# Patient Record
Sex: Male | Born: 1990 | Race: White | Hispanic: No | Marital: Married | Smoking: Never smoker
Health system: Southern US, Community
[De-identification: ages and names within clinical notes are randomized; demographics above are authoritative.]

## PROBLEM LIST (undated history)

## (undated) DIAGNOSIS — F32A Depression, unspecified: Secondary | ICD-10-CM

## (undated) DIAGNOSIS — F419 Anxiety disorder, unspecified: Secondary | ICD-10-CM

## (undated) DIAGNOSIS — K219 Gastro-esophageal reflux disease without esophagitis: Secondary | ICD-10-CM

## (undated) DIAGNOSIS — M25561 Pain in right knee: Secondary | ICD-10-CM

## (undated) HISTORY — DX: Pain in right knee: M25.561

## (undated) HISTORY — DX: Depression, unspecified: F32.A

## (undated) HISTORY — DX: Anxiety disorder, unspecified: F41.9

## (undated) HISTORY — PX: EYE SURGERY: SHX253

## (undated) HISTORY — DX: Gastro-esophageal reflux disease without esophagitis: K21.9

---
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
## 2012-01-06 HISTORY — PX: KNEE CARTILAGE SURGERY: SHX688

## 2016-02-10 ENCOUNTER — Ambulatory Visit (INDEPENDENT_AMBULATORY_CARE_PROVIDER_SITE_OTHER): Payer: 59 | Admitting: Family Medicine

## 2016-02-10 ENCOUNTER — Encounter: Payer: Self-pay | Admitting: Family Medicine

## 2016-02-10 VITALS — BP 110/72 | HR 96 | Temp 98.2°F | Ht 70.75 in | Wt 238.2 lb

## 2016-02-10 DIAGNOSIS — G8929 Other chronic pain: Secondary | ICD-10-CM | POA: Diagnosis not present

## 2016-02-10 DIAGNOSIS — M25561 Pain in right knee: Secondary | ICD-10-CM | POA: Diagnosis not present

## 2016-02-10 DIAGNOSIS — M222X1 Patellofemoral disorders, right knee: Secondary | ICD-10-CM

## 2016-02-10 NOTE — Assessment & Plan Note (Signed)
S:Right knee pain for several years. Arthroscopic surgery in [place]. Ran track in HS- dislocated knee cap. 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  6 months later needed surgery for cartilage in the knee in  December 2013.   Since that time describes Dull pain about 5/10 stable for these years. Pain located vaguely around the kneecap or just behind it. Cortisone shot a year after surgery. Helped short term. Feels popping/clicking going up and downstairs.    2 weeks ago and at work standing up felt a sharp pain and felt like knee really tightened. Pain was on medial side.  Made him nervous to walk. Has stopped doing his working out/started taking it easy. Not doing as much walking or running.  hasnt biked since graduating may 2018 and not sure if that will bother him. New puppy. Up exercising around 4 30- walk/jogging with dog and some weights but has held off for last few weeks.  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 Will trial home exercises for next 4 weeks. We discussed evidence for nsaids is not strong but if he has another flare in pain like 2 weeks ago then can do short term icing and nsaids.   He did mention at times knee has given way but none recently. His meniscus and ligaments seemed stable today which reassured him. We discussed if he is not improving within 4 weeks that we would get a sports medicine consult with consideration of ultrasound.

## 2016-02-10 NOTE — Patient Instructions (Signed)
Sign release of information at the check out desk for 1.  records from your prior surgery and MRI and [place] 2. Immun[facility] only [facility]  Let's trial conservative measures for the patellofemoral syndrome. Focus on doing the exercises at least 4x a week for a month. If not improving at that point, I will put in a referral for one of our [place] sports medicine doctors either Dr. Berline Choughigby or Dr. Katrinka BlazingSmith

## 2016-02-10 NOTE — Progress Notes (Signed)
Phone: [phone]  Subjective:  Patient presents today to establish care. [facility]  Chief complaint-noted.   See problem oriented charting  The following were reviewed and entered/updated in epic: Past Medical History:  Diagnosis Date  . Right knee pain    Patient Active Problem List   Diagnosis Date Noted  . Right knee pain    Past Surgical History:  Procedure Laterality Date  . EYE SURGERY     "cross eyed as child" 1st grade  . KNEE CARTILAGE SURGERY Right 01/2012    Family History  Problem Relation Age of Onset  . Healthy Mother   . Stroke Father   . Hyperlipidemia Father   . Hypertension Father   . Healthy Sister   . Prostate cancer Paternal Grandfather     uknown age    Medications- reviewed and updated No current outpatient prescriptions on file.   No current facility-administered medications for this visit.     Allergies-reviewed and updated Allergies  Allergen Reactions  . Azithromycin Hives    Social History   Social History  . Marital status: Single    Spouse name: N/A  . Number of children: N/A  . Years of education: N/A   Social History Main Topics  . Smoking status: Never Smoker  . Smokeless tobacco: Never Used  . Alcohol use Yes     Comment: Social 1-2 a week  . Drug use: No  . Sexual activity: Yes   Other Topics Concern  . None   Social History Narrative   Single. Lives alone. 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       Undergrad [employer]- history major.    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       Hobbies: enjoys cooking, likes running, mountain biking    ROS--Full ROS was completed Review of Systems  Constitutional: Negative for chills and fever.  HENT: Negative for hearing loss and tinnitus.   Eyes: Negative for blurred vision and double vision.  Respiratory: Negative for cough and hemoptysis.   Cardiovascular: Negative for chest pain and  palpitations.  Gastrointestinal: Negative for heartburn and nausea.  Genitourinary: Negative for dysuria and urgency.  Musculoskeletal: Positive for joint pain (right knee). Negative for falls and myalgias.  Skin: Negative for itching and rash.  Neurological: Negative for dizziness and headaches.  Endo/Heme/Allergies: Negative for polydipsia. Does not bruise/bleed easily.  Psychiatric/Behavioral: Negative for hallucinations, substance abuse and suicidal ideas.   Objective: BP 110/72 (BP Location: Left Arm, Patient Position: Sitting, Cuff Size: Large)   Pulse 96   Temp 98.2 F (36.8 C) (Oral)   Ht 5' 10.75" (1.797 m)   Wt 238 lb 3.2 oz (108 kg)   SpO2 97%   BMI 33.46 kg/m  Gen: NAD, resting comfortably HEENT: Mucous membranes are moist. Oropharynx normal. TM normal. Eyes: sclera and lids normal, PERRLA Neck: no thyromegaly, no cervical lymphadenopathy CV: RRR no murmurs rubs or gallops Lungs: CTAB no crackles, wheeze, rhonchi Abdomen: soft/nontender/nondistended/normal bowel sounds. No rebound or guarding.  Ext: no edema Skin: warm, dry Msk: see below Neuro: 5/5 strength in upper and lower extremities, normal gait, normal reflexes  Bilateral Knee: Normal to inspection with no erythema or effusion or obvious bony abnormalities. Palpation normal with no warmth or joint line tenderness or patellar tenderness or condyle tenderness. ROM normal in flexion and extension and lower leg rotation. Ligaments with solid consistent endpoints including ACL, PCL, LCL, MCL. Negative Mcmurray's and  provocative meniscal tests. There is a click reproducible on right knee but feels like this is around the patella and produces no pain.  On left- Non painful patellar compression. On right mild vague discomfort with compression.  Patellar and quadriceps tendons unremarkable. Hamstring and quadriceps strength is normal.   Assessment/Plan:  Right knee pain S:Right knee pain for several years.  Arthroscopic surgery in [place]. Ran track in HS- dislocated knee cap. 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  6 months later needed surgery for cartilage in the knee in  December 2013.   Since that time describes Dull pain about 5/10 stable for these years. Pain located vaguely around the kneecap or just behind it. Cortisone shot a year after surgery. Helped short term. Feels popping/clicking going up and downstairs.    2 weeks ago and at work standing up felt a sharp pain and felt like knee really tightened. Pain was on medial side.  Made him nervous to walk. Has stopped doing his working out/started taking it easy. Not doing as much walking or running.  hasnt biked since graduating may 2018 and not sure if that will bother him. New puppy. Up exercising around 4 30- walk/jogging with dog and some weights but has held off for last few weeks.  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 Will trial home exercises for next 4 weeks. We discussed evidence for nsaids is not strong but if he has another flare in pain like 2 weeks ago then can do short term icing and nsaids.   He did mention at times knee has given way but none recently. His meniscus and ligaments seemed stable today which reassured him. We discussed if he is not improving within 4 weeks that we would get a sports medicine consult with consideration of ultrasound.   The duration of face-to-face time during this visit was greater than 30 minutes. Greater than 50% of this time was spent in counseling about dealing with chronicity of pain, nervousness about more serious injury, fact I am more concerned because of prior surgery and lower threshold for referral than usually for someone his age- in addition the giving way we discussed raised my concern- glad has not happened recently. Discussing treatment options and data on some. He may end up doing a knee sleeve while being active at present.    Did not get gardasil. States  he did have meningitis and varicella- will get records. Also to get MRI records from [place] and ortho notes   Return precautions advised.  Tana Conch, MD

## 2016-02-10 NOTE — Progress Notes (Signed)
Pre visit review using our clinic review tool, if applicable. No additional management support is needed unless otherwise documented below in the visit note. 

## 2017-11-25 ENCOUNTER — Ambulatory Visit: Payer: BLUE CROSS/BLUE SHIELD | Admitting: Family Medicine

## 2017-11-25 ENCOUNTER — Encounter: Payer: Self-pay | Admitting: Family Medicine

## 2017-11-25 ENCOUNTER — Encounter: Payer: Self-pay | Admitting: Sports Medicine

## 2017-11-25 ENCOUNTER — Ambulatory Visit: Payer: BLUE CROSS/BLUE SHIELD | Admitting: Sports Medicine

## 2017-11-25 ENCOUNTER — Ambulatory Visit: Payer: Self-pay

## 2017-11-25 VITALS — BP 122/82 | HR 70 | Ht 70.75 in | Wt 244.6 lb

## 2017-11-25 VITALS — BP 122/82 | HR 70 | Temp 98.5°F | Ht 70.75 in | Wt 244.6 lb

## 2017-11-25 DIAGNOSIS — Z23 Encounter for immunization: Secondary | ICD-10-CM

## 2017-11-25 DIAGNOSIS — M25561 Pain in right knee: Secondary | ICD-10-CM

## 2017-11-25 NOTE — Patient Instructions (Signed)
You had an injection today.  Things to be aware of after injection are listed below: . You may experience no significant improvement or even a slight worsening in your symptoms during the first 24 to 48 hours.  After that we expect your symptoms to improve gradually over the next 2 weeks for the medicine to have its maximal effect.  You should continue to have improvement out to 6 weeks after your injection. . Dr. Caralyn Twining recommends icing the site of the injection for 20 minutes  1-2 times the day of your injection . You may shower but no swimming, tub bath or Jacuzzi for 24 hours. . If your bandage falls off this does not need to be replaced.  It is appropriate to remove the bandage after 4 hours. . You may resume light activities as tolerated unless otherwise directed per Dr. Kamiya Acord during your visit  POSSIBLE STEROID SIDE EFFECTS:  Side effects from injectable steroids tend to be less than when taken orally however you may experience some of the symptoms listed below.  If experienced these should only last for a short period of time. Change in menstrual flow  Edema (swelling)  Increased appetite Skin flushing (redness)  Skin rash/acne  Thrush (oral) Yeast vaginitis    Increased sweating  Depression Increased blood glucose levels Cramping and leg/calf  Euphoria (feeling happy)  POSSIBLE PROCEDURE SIDE EFFECTS: The side effects of the injection are usually fairly minimal however if you may experience some of the following side effects that are usually self-limited and will is off on their own.  If you are concerned please feel free to call the office with questions:  Increased numbness or tingling  Nausea or vomiting  Swelling or bruising at the injection site   Please call our office if if you experience any of the following symptoms over the next week as these can be signs of infection:   Fever greater than 100.5F  Significant swelling at the injection site  Significant redness or drainage  from the injection site  If after 2 weeks you are continuing to have worsening symptoms please call our office to discuss what the next appropriate actions should be including the potential for a return office visit or other diagnostic testing.    

## 2017-11-25 NOTE — Procedures (Signed)
PROCEDURE NOTE:  Ultrasound Guided: Aspiration and Injection: Right knee Images were obtained and interpreted by myself, Gaspar Bidding, DO  Images have been saved and stored to PACS system. Images obtained on: GE S7 Ultrasound machine    ULTRASOUND FINDINGS:  Large effusion.  DESCRIPTION OF PROCEDURE:  The patient's clinical condition is marked by substantial pain and/or significant functional disability. Other conservative therapy has not provided relief, is contraindicated, or not appropriate. There is a reasonable likelihood that injection will significantly improve the patient's pain and/or functional impairment.   After discussing the risks, benefits and expected outcomes of the injection and all questions were reviewed and answered, the patient wished to undergo the above named procedure.  Verbal consent was obtained.  The ultrasound was used to identify the target structure and adjacent neurovascular structures. The skin was then prepped in sterile fashion and the target structure was injected under direct visualization using sterile technique as below:  Single injection performed as below: PREP: Alcohol, Ethel Chloride and 5 cc 1% lidocaine on 25g 1.5 in. needle APPROACH:superiolateral, stopcock technique, 18g 1.5 in. INJECTATE: 2 cc 0.5% Marcaine and 2 cc 40mg /mL DepoMedrol ASPIRATE: 55mL  and clear, bloody and straw colored  DRESSING: Band-Aid and 6-inch Ace Wrap  Post procedural instructions including recommending icing and warning signs for infection were reviewed.    This procedure was well tolerated and there were no complications.   IMPRESSION: Succesful Ultrasound Guided: Aspiration and Injection

## 2017-11-25 NOTE — Progress Notes (Signed)
Subjective:  Marvin Mitchell is a 27 y.o. year old very pleasant male patient who presents for/with See problem oriented charting ROS- no fever or chills.  Right knee pain noted.  Right knee effusion noted  Past Medical History-  Patient Active Problem List   Diagnosis Date Noted  . Right knee pain     Medications- reviewed and updated No current outpatient medications on file.   No current facility-administered medications for this visit.     Objective: BP 122/82 (BP Location: Left Arm, Patient Position: Sitting, Cuff Size: Large)   Pulse 70   Temp 98.5 F (36.9 C) (Oral)   Ht 5' 10.75" (1.797 m)   Wt 244 lb 9.6 oz (110.9 kg)   SpO2 98%   BMI 34.36 kg/m  Gen: NAD, resting comfortably CV: RRR  Lungs: nonlabored, normal respiratory rate Abdomen: soft/nondistended Ext: no pretibial edema Skin: warm, dry  Right Knee: Inspection with no erythema. Effusion noted. No obvious bony abnormalities.  Assessment/Plan:  Acute pain of right knee - Plan: Ambulatory referral to Sports Medicine S: Patient with years of dull chronic right knee pain. From last visit in 02/2016 "Right knee pain for several years. Arthroscopic surgery in [place]. Ran track in HS- dislocated knee cap. 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  6 months later needed surgery for cartilage in the knee in  December 2013.   Since that time describes Dull pain about 5/10 stable for these years. Pain located vaguely around the kneecap or just behind it. Cortisone shot a year after surgery. Helped short term. Feels popping/clicking going up and downstairs.  "  He had a flare up of pain around that time-We treated him for possible patellofemoral syndrome at that time- he did better at that time but went back to baseline level of pain.   Has wanted to start running in recent years but just hasnt trusted the knee enough. With more controlled movements like squats he feels like he can tell when an  issues is coming up. We had considered sports medicine referral last year but given improvement in pain with home exercises opted out.   Today, states over last few weeks has had worsening pain with motion of right knee. When he gets past about 90 degrees bending down- quadriceps down into shins feels like a rubber band being pulled across an arch. Has had to sit on the ground or  lean over by back to pick things upas doesn't feel he can bend on right knee- can bend with left knee without issue. Pain can be at least moderate aching in intensity. Icing helps some.   Has been working out a lot more in general so was wondering if it was related to that. Yesterday was doing fair amount of lifting but was avoiding bending the knee- but by yesterday afternoon knee felt really tight. Noted significant swelling for first time yesterday. Iced last night and this morning but remained larger.  He hears a lot of clicking and popping in his knees in general. A/P: 27 year old male with history of right knee surgery now with acute worsening of pain in last few weeks and particularly in last 2 days associated with effusion. Will refer to sports medicine Marvin Mitchell for further management. Since he feels so uncomfortable with flexing lower leg- we did not do a full knee exam today until Marvin Mitchell can evaluate.   Future Appointments  Date Time Provider Department Center  11/25/2017  2:40 PM Andrena Mews, DO [facility]   Lab/Order associations: Need for prophylactic vaccination and inoculation against influenza - Plan: Flu Vaccine QUAD 36+ mos IM  Acute pain of right knee - Plan: Ambulatory referral to Sports Medicine  Return precautions advised.  Tana Conch, MD

## 2017-11-25 NOTE — Progress Notes (Signed)
Marvin Mitchell. [facility] at [facility] [phone]  Marvin Mitchell - 27 y.o. male MRN [id number]  Date of birth: 1990/02/26  Visit Date: 11/25/2017  PCP: Marvin Majestic, MD   Referred by: Marvin Majestic, MD   Scribe(s) for today's visit: Marvin Mitchell, CMA  SUBJECTIVE:  Marvin Sousa "Alex" is here for Initial Assessment (R knee pain)  Referred by: Dr. Tana Mitchell  HPI: His R knee pain symptoms INITIALLY: Began several years ago after dislocating his knee running track, 01/2012. He reports constant dull pain since then.  Described as moderate (6/10) tightness and warmth, nonradiating Worsened with bending Improved with nonweightbearing, though improvement is minimal. Additional associated symptoms include: Her has noticing popping in his when going up and down stairs (this happens with both knees). He can hear and feel the popping. He feels a lot of tension in his knee when bending beyond 90 degrees, the more he bends the knee, the worse it gets.     At this time symptoms are worsening compared to onset, this started about 1 month ago and seems to be getting progressively worse. He was helping to move over the weekend. Sunday he noticed a lot of tightness and swelling around the knee, R knee was "three times the size" of his L knee.  He has been taking IBU with minimal relief. He tried icing his knee and didn't notice any change with swelling.   No recent XR of the R knee, last done 2014.   REVIEW OF SYSTEMS: Reports night time disturbances. Denies fevers, chills, or night sweats. Denies unexplained weight loss. Denies personal history of cancer. Denies changes in bowel or bladder habits. Denies recent unreported falls. Denies new or worsening dyspnea or wheezing. Denies headaches or dizziness.  Denies numbness, tingling or weakness  In the extremities.  Denies dizziness or presyncopal episodes Reports lower extremity  edema    HISTORY:  Prior history reviewed and updated per electronic medical record.  Social History   Occupational History  . Not on file  Tobacco Use  . Smoking status: Never Smoker  . Smokeless tobacco: Never Used  Substance and Sexual Activity  . Alcohol use: Yes    Comment: Social 1-2 a week  . Drug use: No  . Sexual activity: Yes   Social History   Social History Narrative   Engaged! Getting married December 28th 2019. 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       Undergrad [employer]- history major.    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       Hobbies: enjoys cooking, likes running, mountain biking    Past Medical History:  Diagnosis Date  . Right knee pain    Past Surgical History:  Procedure Laterality Date  . EYE SURGERY     "cross eyed as child" 1st grade  . KNEE CARTILAGE SURGERY Right 01/2012   family history includes Healthy in his mother and sister; Hyperlipidemia in his father; Hypertension in his father; Prostate cancer in his paternal grandfather; Stroke in his father.  DATA OBTAINED & REVIEWED:  No results for input(s): HGBA1C, LABURIC, CREATINE in the last 8760 hours. .   OBJECTIVE:  VS:  HT:5' 10.75" (179.7 cm)   WT:244 lb 9.6 oz (110.9 kg)  BMI:34.36    BP:122/82  HR:70bpm  TEMP: ( )  RESP:98 %   PHYSICAL EXAM: CONSTITUTIONAL: Well-developed, Well-nourished and In no acute distress PSYCHIATRIC: Alert &  appropriately interactive. and Not depressed or anxious appearing. RESPIRATORY: No increased work of breathing and Trachea Midline EYES: Pupils are equal., EOM intact without nystagmus. and No scleral icterus.  VASCULAR EXAM: Warm and well perfused NEURO: unremarkable  MSK Exam: Right knee  Well aligned, no significant deformity. No overlying skin changes. TTP over Medial joint line without focality.  Large effusion.   RANGE OF MOTION & STRENGTH  Limited knee flexion by approximately 25 degrees  compared to the contralateral side.   SPECIALITY TESTING:  Pain with McMurray's and crepitation slightly localized in the medial joint line.  Slightly increased laxity with dial testing on the right, stable on the left.  Ligamentously stable to anterior drawer, posterior drawer, varus and valgus strain.    ASSESSMENT   1. Acute pain of right knee     PLAN:  Pertinent additional documentation may be included in corresponding procedure notes, imaging studies, problem based documentation and patient instructions.  Procedures:  . US Guided Injection per procedure note  Medications:  No orders of the defined types were placed in this encounter.  Discussion/Instructions: Right knee pain Large effusion today.  Aspiration and injection performed.  If any lack of improvement will need x-rays and or MRI for concern for potential recurrent meniscal tear.  Would also keep posterior lateral corner injury in the differential given the slightly increased laxity with dial test  .   . Discussed red flag symptoms that warrant earlier emergent evaluation and patient voices understanding. . Activity modifications and the importance of avoiding exacerbating activities (limiting pain to no more than a 4 / 10 during or following activity) recommended and discussed.  Follow-up:  . Return in about 4 weeks (around 12/23/2017).   . If any lack of improvement consider: further diagnostic evaluation with Plain film x-rays and MRI.  Marland Kitchen At follow up will plan to consider: Increase HEP     CMA/ATC served as scribe during this visit. History, Physical, and Plan performed by medical provider. Documentation and orders reviewed and attested to.      Marvin Mews, DO     Sports Medicine Physician

## 2017-11-25 NOTE — Patient Instructions (Addendum)
Health Maintenance Due  Topic Date Due  . INFLUENZA VACCINE -completed today 09/05/2017   Please schedule a visit with our sports medicine physician Dr. Berline Chough before you leave at the check out desk so he can further evaluate your right knee pain- ask them to put you in the 2 40 slot with him.

## 2017-11-25 NOTE — Assessment & Plan Note (Signed)
Large effusion today.  Aspiration and injection performed.  If any lack of improvement will need x-rays and or MRI for concern for potential recurrent meniscal tear.  Would also keep posterior lateral corner injury in the differential given the slightly increased laxity with dial test

## 2017-12-24 ENCOUNTER — Ambulatory Visit: Payer: BLUE CROSS/BLUE SHIELD | Admitting: Sports Medicine

## 2018-01-01 ENCOUNTER — Ambulatory Visit: Payer: BLUE CROSS/BLUE SHIELD | Admitting: Sports Medicine

## 2018-01-09 ENCOUNTER — Ambulatory Visit: Payer: BLUE CROSS/BLUE SHIELD | Admitting: Sports Medicine

## 2019-01-08 ENCOUNTER — Other Ambulatory Visit: Payer: Self-pay

## 2019-01-09 ENCOUNTER — Encounter: Payer: Self-pay | Admitting: Family Medicine

## 2019-01-09 ENCOUNTER — Ambulatory Visit (INDEPENDENT_AMBULATORY_CARE_PROVIDER_SITE_OTHER): Payer: BC Managed Care – PPO | Admitting: Family Medicine

## 2019-01-09 VITALS — BP 112/90 | HR 87 | Temp 98.7°F | Ht 70.75 in | Wt 230.4 lb

## 2019-01-09 DIAGNOSIS — Z23 Encounter for immunization: Secondary | ICD-10-CM

## 2019-01-09 DIAGNOSIS — G8929 Other chronic pain: Secondary | ICD-10-CM | POA: Diagnosis not present

## 2019-01-09 DIAGNOSIS — M25511 Pain in right shoulder: Secondary | ICD-10-CM

## 2019-01-09 MED ORDER — MELOXICAM 15 MG PO TABS: 15.0000 mg | ORAL_TABLET | Freq: Every day | ORAL | 0 refills | Status: DC

## 2019-01-09 NOTE — Patient Instructions (Addendum)
Health Maintenance Due  Topic Date Due  . INFLUENZA VACCINE -today 09/06/2018  . TETANUS/TDAP -today 12/02/2018   Team- please give him a copy of rotator cuff exercises. Do these 3x a week as instructed. Stop ANY exercise that causes more than 1-2/10 pain and go to next one.   Marvin Mitchell- Try meloxicam once a day for 10 days. If any reflux could take pepcid or omeprazole with this. This is to try to calm down inflammation  If not making progress within 7-10 days let me get you in with Dr. Tamala Julian or Dr. Georgina Snell of sports medicine- I think this is a partial rotator cuff tear given you have good strength and ROM still but they may think its worth ultrasound or injection at that point

## 2019-01-09 NOTE — Progress Notes (Signed)
Phone [phone] In person visit   Subjective:   Marvin Mitchell is a 28 y.o. year old very pleasant male patient who presents for/with See problem oriented charting Chief Complaint  Patient presents with  . Follow-up  . shoulder pain   ROS- no fever/chills/cough/congestion.  Does complain of right shoulder pain  This visit occurred during the SARS-CoV-2 public health emergency.  Safety protocols were in place, including screening questions prior to the visit, additional usage of staff PPE, and extensive cleaning of exam room while observing appropriate contact time as indicated for disinfecting solutions.   Past Medical History-  Patient Active Problem List   Diagnosis Date Noted  . Right knee pain     Medications- reviewed and updated Current Outpatient Medications  Medication Sig Dispense Refill  . meloxicam (MOBIC) 15 MG tablet Take 1 tablet (15 mg total) by mouth daily. 10 tablet 0   No current facility-administered medications for this visit.      Objective:  BP 112/90   Pulse 87   Temp 98.7 F (37.1 C)   Ht 5' 10.75" (1.797 m)   Wt 230 lb 6.4 oz (104.5 kg)   SpO2 98%   BMI 32.36 kg/m  Gen: NAD, resting comfortably  CV: RRR  Lungs: nonlabored, normal respiratory rate Abdomen: soft/nondistended   Shoulder: Inspection reveals no abnormalities, atrophy or asymmetry. Palpation is normal with no tenderness over AC joint or bicipital groove. ROM is full in all planes. Rotator cuff strength normal throughout. With rotating right arm backwards- slight popping sound heard signs of impingement with positive Hawkin's BUT  negative Neer and Hawkin's tests Patient does have significant pain with resisted internal rotation.  No painful arc and no drop arm sign. No apprehension sign       Assessment and Plan   #social update- signed on first home on Monday. 1st year of marriage going well- dec 2019  # Right Shoulder pain S:pt c/o right shoulder pain  that started about 6 weeks ago. States was at the gym when he noted it but didn't note an actual injury- just over time arm felt weaker and was having trouble lifting over 10 lbs. Has taken time off lifting since then.  Attributes weakness to the pain. Did not receive flu shot until today.   Pt states he has been doing ice and heat and ibuprofen with some relief. Pt states pain comes and goes and can get to a 7/10. No swelling or redness around the shoulder.   No history of shoulder injuries. When doing overhead lifts in the past has noted a slight popping sensation in the right shoulder or in incline bench- basically anything above 135 degrees but left shoulder does not have same sensation- never had pain with this.  A/P: I suspect patient has a rotator cuff tear-possibly the subscapularis.  Strength overall still good.  I think a trial of NSAIDs and therapeutic exercise is reasonable.  We discussed if not improving in the next 7 to 10 days that we would go ahead and refer to sports medicine for consideration of ultrasound and injection.  Recommended follow up: # likely need physical and bloodwork sometime in next 6-12 months-discussed completing the since we have not done prior blood work.  He has no reported kidney disease history so we thought a trial of NSAID would be reasonable  Lab/Order associations:   ICD-10-CM   1. Chronic right shoulder pain  M25.511    G89.29   2. Need for Tdap  vaccination  Z23 Tdap vaccine greater than or equal to 7yo IM  3. Need for immunization against influenza  Z23 Flu Vaccine QUAD 36+ mos IM    Meds ordered this encounter  Medications  . meloxicam (MOBIC) 15 MG tablet    Sig: Take 1 tablet (15 mg total) by mouth daily.    Dispense:  10 tablet    Refill:  0    Return precautions advised.  Garret Reddish, MD

## 2019-01-10 ENCOUNTER — Encounter: Payer: Self-pay | Admitting: Family Medicine

## 2019-01-22 ENCOUNTER — Encounter: Payer: Self-pay | Admitting: Family Medicine

## 2019-01-23 ENCOUNTER — Other Ambulatory Visit: Payer: Self-pay

## 2019-01-23 DIAGNOSIS — G8929 Other chronic pain: Secondary | ICD-10-CM

## 2019-01-23 DIAGNOSIS — M25511 Pain in right shoulder: Secondary | ICD-10-CM

## 2019-02-04 ENCOUNTER — Other Ambulatory Visit: Payer: Self-pay

## 2019-02-04 ENCOUNTER — Ambulatory Visit (INDEPENDENT_AMBULATORY_CARE_PROVIDER_SITE_OTHER): Payer: BC Managed Care – PPO

## 2019-02-04 ENCOUNTER — Ambulatory Visit: Payer: BC Managed Care – PPO | Admitting: Family Medicine

## 2019-02-04 ENCOUNTER — Encounter: Payer: Self-pay | Admitting: Family Medicine

## 2019-02-04 VITALS — BP 118/84 | HR 84 | Ht 70.75 in | Wt 232.4 lb

## 2019-02-04 DIAGNOSIS — M25511 Pain in right shoulder: Secondary | ICD-10-CM

## 2019-02-04 NOTE — Patient Instructions (Signed)
Thank you for coming in today. Attend PT.  Recheck in 4 weeks.  Next step if not better is likely MRI arthrogram.  I am concerned about a SLAP labrum tear.    SLAP Lesions Rehab Ask your health care provider which exercises are safe for you. Do exercises exactly as told by your health care provider and adjust them as directed. It is normal to feel mild stretching, pulling, tightness, or discomfort as you do these exercises. Stop right away if you feel sudden pain or your pain gets worse. Do not begin these exercises until told by your health care provider. Stretching and range-of-motion exercise This exercise warms up your muscles and joints and improves the movement and flexibility of your shoulder. This exercise also helps to relieve pain and stiffness. Passive shoulder horizontal adduction In passive adduction, you use your other hand to move the injured arm toward your body. The injured arm does not move on its own (passive). In this movement, your arm is moved across your body in the horizontal plane (horizontal adduction). 1. Sit or stand and pull your left / right elbow across your chest, toward your other shoulder. Stop when you feel a gentle stretch in the back of your shoulder and upper arm. ? Keep your arm at shoulder height. ? Keep your arm as close to your body as you comfortably can. 2. Hold for __________ seconds. 3. Slowly return to the starting position. Repeat __________ times. Complete this exercise __________ times a day. Strengthening exercises These exercises build strength and endurance in your shoulder. Endurance is the ability to use your muscles for a long time, even after they get tired. Scapular protraction, supine  1. Lie on your back on a firm surface (supine position). Hold a __________ lb / kg weight in your left / right hand. 2. Raise your left / right arm straight into the air so your hand is directly above your shoulder joint. 3. Push the weight into the  air so your shoulder (scapula) lifts off the surface that you are lying on. The scapula will push up or forward (protraction). Do not move your head, neck, or back. 4. Hold for __________ seconds. 5. Slowly return to the starting position. Let your muscles relax completely before you repeat this exercise. Repeat __________ times. Complete this exercise __________ times a day. Scapular retraction  1. Sit in a stable chair without armrests, or stand up. 2. Secure an exercise band to a stable object in front of you so the band is at shoulder height. 3. Hold one end of the exercise band in each hand. Your palms should face down. 4. Squeeze your shoulder blades (scapulae) together and move your elbows slightly behind you (retraction). Do not shrug your shoulders. 5. Hold for __________ seconds. 6. Slowly return to the starting position. Repeat __________ times. Complete this exercise __________ times a day. Shoulder external rotation 1. Lie down on your uninjured side. Place a soft object, such as a small folded towel, between your left / right arm (your top arm) and your body. 2. Bend your left / right elbow to a 90-degree angle (right angle). Place your left / right hand palm-down on your abdomen. Squeeze your shoulder blade back. 3. Keeping your elbow bent to a 90-degree angle, move your left / right forearm away from your abdomen (external rotation). ? Your upper arm should not move off the folded towel. ? Keep your shoulder blade back. 4. Hold for __________ seconds. 5. Slowly return  to the starting position. Repeat __________ times. Complete this exercise __________ times a day. Shoulder extension, prone  1. Lie on your abdomen (prone position) on a firm surface so your left / right arm hangs over the edge. 2. Hold a __________ lb / kg weight in your hand so your palm faces in toward your body. Your arm should be straight. 3. Squeeze your shoulder blade down toward the middle of your back.  4. Slowly raise your arm behind you, up to the height of the surface that you are lying on (extension). Keep your arm straight. 5. Hold for __________ seconds. 6. Slowly return to the starting position and relax your muscles. Repeat __________ times. Complete this exercise __________ times a day. This information is not intended to replace advice given to you by your health care provider. Make sure you discuss any questions you have with your health care provider. Document Released: 01/22/2005 Document Revised: 05/20/2018 Document Reviewed: 05/20/2018 Elsevier Patient Education  2020 [employer].

## 2019-02-04 NOTE — Progress Notes (Signed)
Subjective:    I'm seeing this patient as a consultation for:  Dr. Yong Channel. Note will be routed back to referring provider/PCP.  CC: R shoulder pain  I, Molly Weber, LAT, ATC, am serving as scribe for Dr. Lynne Leader.  HPI: Pt is a 28 y/o male presenting w/ c/o R shoulder pain x approximately 2 months w/ no specific MOI.  Pt recalls noticing pain while at the gym but doesn't recall a specific injury.  Pt notes weakness in his R UE.  Pt has intermittent pain that can increase to a 7/10 at it's worst.  Since seeing his PCP on 01/09/19, pt reports that his R shoulder feels about the same.  He has avoided going to the gym but states that he aggravated recently when moving into his new home.  He notes radiating pain into the R upper arm.  He rates his pain as an aching 5/10 pain on average.  Aggravating factors include laying on his R side and horizontal aBd.  He notes popping/clicking in his R shoulder.  He denies neck pain or numbness/tingling into the R UE.  He is not doing any regular treatment for his symptoms.    Past medical history, Surgical history, Family history not pertinant except as noted below, Social history, Allergies, and medications have been entered into the medical record, reviewed, and no changes needed.   Review of Systems: No headache, visual changes, nausea, vomiting, diarrhea, constipation, dizziness, abdominal pain, skin rash, fevers, chills, night sweats, weight loss, swollen lymph nodes, body aches, joint swelling, muscle aches, chest pain, shortness of breath, mood changes, visual or auditory hallucinations.   Objective:    Vitals:   02/04/19 1457  BP: 118/84  Pulse: 84  SpO2: 96%   General: Well Developed, well nourished, and in no acute distress.  Neuro/Psych: Alert and oriented x3, extra-ocular muscles intact, able to move all 4 extremities, sensation grossly intact. Skin: Warm and dry, no rashes noted.  Respiratory: Not using accessory muscles, speaking in full  sentences, trachea midline.  Cardiovascular: Pulses palpable, no extremity edema. Abdomen: Does not appear distended. MSK:  C-spine: Normal-appearing. Nontender. Normal motion. Upper extremity strength reflexes and sensation are normal and equal throughout bilateral upper extremities.  Right shoulder: Normal-appearing Not particularly tender to palpation. Range of motion full abduction external and internal rotation.  Palpable and audible click with abduction Strength normal intact 5/5 abduction external and internal rotation. Negative Hawkins and Neer's test.  Negative empty can test.  Negative crossover arm compression test. Positive sulcus sign with axial traction. Mildly positive O'Brien test. Negative/normal anterior and posterior apprehension and relocation tests. Positive Yergason's and speeds test mildly  Contralateral left shoulder: Normal-appearing Nontender Normal motion. Normal strength. Negative impingement biceps and labrum testing.  Pulses cap refill and sensation intact bilateral upper extremities.  Lab and Radiology Results  Limited musculoskeletal ultrasound right shoulder Biceps tendon normal-appearing intact in bicipital groove. Normal subscapularis tendon. Supraspinatus tendon is normal-appearing.  Moderate increased thickness of subacromial bursa present. Infraspinatus tendon normal-appearing No significant AC DJD or effusion. V-shaped groove or deformity present at posterior aspect of the humeral head. Impression: No significant biceps or rotator cuff tendinitis visible on ultrasound. Mild subacromial bursa thickening. Possible Hill-Sachs deformity  X-ray right shoulder ordered today but patient elected to have it performed in the near future.  Impression and Recommendations:    Assessment and Plan: 28 y.o. male with  Right shoulder pain present for 2 months.  Patient has had trials of  some initial conservative management with his primary care  provider with moderate improvement.  However he continues to have significant dysfunction and mechanical symptoms and pain especially with weightlifting.  Based on physical exam and ultrasound findings today concern for SLAP labrum tear. Discussed treatment plan and options.  We will continue trial of conservative management we will proceed with trial of physical therapy.  Check back in about 4 weeks.  If no improvement next step would be MRI arthrogram.  Extensive discussion with patient expresses understanding and agreement.   Orders Placed This Encounter  Procedures  . [place] - Upper Extremity - Limited - RIGHT    Order Specific Question:   Reason for Exam (SYMPTOM  OR DIAGNOSIS REQUIRED)    Answer:   R shoulder pain    Order Specific Question:   Preferred imaging location?    Answer:   [facility]  . DG Shoulder Right    Standing Status:   Future    Standing Expiration Date:   04/04/2020    Order Specific Question:   Reason for Exam (SYMPTOM  OR DIAGNOSIS REQUIRED)    Answer:   right shoudler pain cocnern labrum    Order Specific Question:   Preferred imaging location?    Answer:   [facility]    Order Specific Question:   Radiology Contrast Protocol - do NOT remove file path    Answer:   [email or web address]  . Ambulatory referral to Physical Therapy    Referral Priority:   Routine    Referral Type:   Physical Medicine    Referral Reason:   Specialty Services Required    Requested Specialty:   Physical Therapy   No orders of the defined types were placed in this encounter.   Discussed warning signs or symptoms. Please see discharge instructions. Patient expresses understanding.   The above documentation has been reviewed and is accurate and complete Clementeen Graham

## 2019-02-12 ENCOUNTER — Ambulatory Visit (INDEPENDENT_AMBULATORY_CARE_PROVIDER_SITE_OTHER): Payer: BC Managed Care – PPO

## 2019-02-12 ENCOUNTER — Other Ambulatory Visit: Payer: Self-pay

## 2019-02-12 ENCOUNTER — Ambulatory Visit (INDEPENDENT_AMBULATORY_CARE_PROVIDER_SITE_OTHER): Payer: BC Managed Care – PPO | Admitting: Physical Therapy

## 2019-02-12 ENCOUNTER — Other Ambulatory Visit: Payer: BC Managed Care – PPO

## 2019-02-12 DIAGNOSIS — M25511 Pain in right shoulder: Secondary | ICD-10-CM

## 2019-02-13 NOTE — Progress Notes (Signed)
Xray shoulder is normal appearing to radiology.

## 2019-02-16 ENCOUNTER — Encounter: Payer: Self-pay | Admitting: Physical Therapy

## 2019-02-16 NOTE — Patient Instructions (Signed)
Shoulder IR/ER GTB x20  Rows GTB x20 Horizontal abd 2x10 GTB Supine SA punch 3 lb bil, x20;  All 1x/day for HEP

## 2019-02-16 NOTE — Therapy (Signed)
[facility] [address] [place], [place], [address] Phone: [phone]   Fax:  [phone]  Physical Therapy Evaluation  Patient Details  Name: Marvin Mitchell MRN: [id number] Date of Birth: 04/17/1990 Referring Provider (PT): Lynne Leader   Encounter Date: 02/12/2019  PT End of Session - 02/16/19 0832    Visit Number  1    Number of Visits  12    Date for PT Re-Evaluation  03/26/19    Authorization Type  BC[employer]    PT Start Time  1518    PT Stop Time  1600    PT Time Calculation (min)  42 min    Activity Tolerance  Patient tolerated treatment well    Behavior During Therapy  [facility] for tasks assessed/performed       Past Medical History:  Diagnosis Date  . Right knee pain     Past Surgical History:  Procedure Laterality Date  . EYE SURGERY     "cross eyed as child" 1st grade  . KNEE CARTILAGE SURGERY Right 01/2012    There were no vitals filed for this visit.   Subjective Assessment - 02/16/19 0835    Subjective  Pain started in [place], in gym, lifting overhead. Has not been to gym since.  States mild improvment since start.  Pt works full time in [place], on computer. States he has always had loud click in shoulder on R.    Limitations  Lifting;House hold activities    Patient Stated Goals  decreased pain    Currently in Pain?  Yes    Pain Score  6     Pain Location  Shoulder    Pain Orientation  Right    Pain Descriptors / Indicators  Aching    Pain Type  Acute pain    Pain Onset  More than a month ago    Pain Frequency  Intermittent         [facility] PT Assessment - 02/16/19 0824      Assessment   Medical Diagnosis  R shoulder pain    Referring Provider (PT)  Lynne Leader    Hand Dominance  Right    Prior Therapy  no      Balance Screen   Has the patient fallen in the past 6 months  No      Prior Function   Level of Independence  Independent      Cognition   Overall Cognitive Status  Within Functional Limits  for tasks assessed      AROM   Overall AROM Comments  R shoulder: WNL,       Strength   Strength Assessment Site  Shoulder    Right/Left Shoulder  Right    Right Shoulder Flexion  4/5    Right Shoulder ABduction  4/5    Right Shoulder Internal Rotation  4+/5    Right Shoulder External Rotation  4+/5      Palpation   Palpation comment  Minimal pain to palpate R shoulder, pain deep in ant/superior aspect of shoulder. Audible pop with lowering arm from abduction(baseline)       Special Tests   Other special tests  Unremarkable                Objective measurements completed on examination: See above findings.      [facility] Adult PT Treatment/Exercise - 02/16/19 0001      Exercises   Exercises  Shoulder      Shoulder  Exercises: Supine   Protraction  20 reps    Protraction Weight (lbs)  3    Protraction Limitations  SA punch      Shoulder Exercises: Standing   Horizontal ABduction  15 reps    Theraband Level (Shoulder Horizontal ABduction)  Level 2 (Red)    External Rotation  20 reps;Theraband    Theraband Level (Shoulder External Rotation)  Level 3 (Green)    Internal Rotation  20 reps;Theraband    Theraband Level (Shoulder Internal Rotation)  Level 3 (Green)    Row  20 reps;Theraband    Theraband Level (Shoulder Row)  Level 3 Chilton Si)             PT Education - 02/16/19 0630    Education Details  HEP, PT POC    Person(s) Educated  Patient    Methods  Explanation;Demonstration;Verbal cues;Handout    Comprehension  Verbalized understanding;Returned demonstration;Verbal cues required;Need further instruction       PT Short Term Goals - 02/16/19 2114      PT SHORT TERM GOAL #1   Title  Pt to be independent with initial HEP    Time  2    Period  Weeks    Status  New    Target Date  02/26/19        PT Long Term Goals - 02/16/19 2115      PT LONG TERM GOAL #1   Title  Pt to be independent with final HEP    Time  6    Period  Weeks    Status   New    Target Date  03/26/19      PT LONG TERM GOAL #2   Title  Pt to report decreased pain in R shoulder to 0-2/10 with activity    Time  6    Period  Weeks    Status  New    Target Date  03/26/19      PT LONG TERM GOAL #3   Title  Pt to demo improved strength in  R shoulder, to be 5/5, to improve ability for lifting, and IADLS.    Time  6    Period  Weeks    Status  New    Target Date  03/26/19      PT LONG TERM GOAL #4   Title  Pt to demo ability for reaching, lifting, carrying, up to 20 lb, with no pain in R shoulder.    Time  6    Period  Weeks    Status  New    Target Date  03/26/19             Plan - 02/16/19 2121    Clinical Impression Statement  Pt presents with primary complaint of increased pain in R shoulder. He has decreased strength and stabiliy of R shoulder, and increased pain with repeated motions and behind the back motions. Pt with decreased ability for full functional activities, due to pain. pt to benefit from skilled PT to improve deficits and pain.    Examination-Activity Limitations  Reach Overhead;Carry;Lift    Examination-Participation Restrictions  Cleaning;Community Activity;Driving;Yard Work    Stability/Clinical Decision Making  Stable/Uncomplicated    Clinical Decision Making  Low    Rehab Potential  Good    PT Frequency  2x / week    PT Duration  6 weeks    PT Treatment/Interventions  ADLs/Self Care Home Management;Cryotherapy;Electrical Stimulation;Iontophoresis 4mg /ml Dexamethasone;Moist Heat;Ultrasound;DME Instruction;Neuromuscular re-education;Therapeutic exercise;Therapeutic activities;Functional mobility training;Patient/family  education;Manual techniques;Taping;Dry needling;Passive range of motion;Vasopneumatic Device;Spinal Manipulations;Joint Manipulations    Consulted and Agree with Plan of Care  Patient       Patient will benefit from skilled therapeutic intervention in order to improve the following deficits and impairments:   Impaired UE functional use, Increased muscle spasms, Decreased activity tolerance, Pain, Improper body mechanics, Decreased strength  Visit Diagnosis: Acute pain of right shoulder     Problem List Patient Active Problem List   Diagnosis Date Noted  . Right knee pain    Sedalia Muta, PT, DPT 9:37 PM  02/16/19    [facility]  [facility] [address] [place], [place], [address] Phone: [phone]   Fax:  [phone]  Name: Marvin Mitchell MRN: [id number] Date of Birth: 16-Jan-1991

## 2019-02-19 ENCOUNTER — Ambulatory Visit (INDEPENDENT_AMBULATORY_CARE_PROVIDER_SITE_OTHER): Payer: BC Managed Care – PPO | Admitting: Physical Therapy

## 2019-02-19 ENCOUNTER — Encounter: Payer: Self-pay | Admitting: Physical Therapy

## 2019-02-19 DIAGNOSIS — M25511 Pain in right shoulder: Secondary | ICD-10-CM

## 2019-02-19 NOTE — Therapy (Addendum)
[facility] [address] [place], [place], [address] Phone: [phone]   Fax:  [phone]  Physical Therapy Treatment  Patient Details  Name: Marvin Mitchell MRN: [id number] Date of Birth: 1991/01/10 Referring Provider (PT): Lynne Leader   Encounter Date: 02/19/2019  PT End of Session - 02/19/19 0808     Visit Number  2    Number of Visits  12    Date for PT Re-Evaluation  03/26/19    Authorization Type  BCBS    PT Start Time  0805    PT Stop Time  0840    PT Time Calculation (min)  35 min    Activity Tolerance  Patient tolerated treatment well    Behavior During Therapy  [facility] for tasks assessed/performed        Past Medical History:  Diagnosis Date   Right knee pain     Past Surgical History:  Procedure Laterality Date   EYE SURGERY     "cross eyed as child" 1st grade   KNEE CARTILAGE SURGERY Right 01/2012    There were no vitals filed for this visit.  Subjective Assessment - 02/19/19 0807     Subjective  Pt states some improvment of pain. Less achey pain at rest    Limitations  Lifting;House hold activities    Patient Stated Goals  decreased pain    Currently in Pain?  Yes    Pain Score  1     Pain Location  Shoulder    Pain Descriptors / Indicators  Aching    Pain Type  Acute pain    Pain Onset  More than a month ago    Pain Frequency  Intermittent                        [facility] Adult PT Treatment/Exercise - 02/19/19 0801       Exercises   Exercises  Shoulder      Shoulder Exercises: Supine   Protraction  20 reps    Protraction Weight (lbs)  3    Protraction Limitations  SA punch    External Rotation  20 reps    External Rotation Weight (lbs)  3    External Rotation Limitations  at 45 and 90 deg     Flexion  AROM;15 reps    Flexion Limitations  small range      Shoulder Exercises: Standing   Horizontal ABduction  20 reps    Theraband Level (Shoulder Horizontal ABduction)   Level 2 (Red)    External Rotation  20 reps;Theraband    Theraband Level (Shoulder External Rotation)  Level 3 (Green)    Internal Rotation  20 reps;Theraband    Theraband Level (Shoulder Internal Rotation)  Level 3 (Green)    Row  20 reps;Theraband    Theraband Level (Shoulder Row)  Level 3 (Green)      Shoulder Exercises: Pulleys   Flexion  2 minutes      Shoulder Exercises: ROM/Strengthening   UBE (Upper Arm Bike)  4 min/ fwd/bwd       Shoulder Exercises: Stretch   Corner Stretch  3 reps;30 seconds    Corner Stretch Limitations  45 deg/ doorway       Manual Therapy   Manual Therapy  Passive ROM    Passive ROM  PROM for R shoulder, all motions                 PT  Short Term Goals - 02/16/19 2114       PT SHORT TERM GOAL #1   Title  Pt to be independent with initial HEP    Time  2    Period  Weeks    Status  New    Target Date  02/26/19         PT Long Term Goals - 02/16/19 2115       PT LONG TERM GOAL #1   Title  Pt to be independent with final HEP    Time  6    Period  Weeks    Status  New    Target Date  03/26/19      PT LONG TERM GOAL #2   Title  Pt to report decreased pain in R shoulder to 0-2/10 with activity    Time  6    Period  Weeks    Status  New    Target Date  03/26/19      PT LONG TERM GOAL #3   Title  Pt to demo improved strength in  R shoulder, to be 5/5, to improve ability for lifting, and IADLS.    Time  6    Period  Weeks    Status  New    Target Date  03/26/19      PT LONG TERM GOAL #4   Title  Pt to demo ability for reaching, lifting, carrying, up to 20 lb, with no pain in R shoulder.    Time  6    Period  Weeks    Status  New    Target Date  03/26/19             Plan - 02/19/19 0845     Clinical Impression Statement  Pt with minimal pain today. Ther ex progressed for strengthening, mostly in neutral positions, will progress to elevation and weight bearing strength as shoulder improves.     Examination-Activity Limitations  Reach Overhead;Carry;Lift    Examination-Participation Restrictions  Cleaning;Community Activity;Driving;Yard Work    Stability/Clinical Decision Making  Stable/Uncomplicated    Rehab Potential  Good    PT Frequency  2x / week    PT Duration  6 weeks    PT Treatment/Interventions  ADLs/Self Care Home Management;Cryotherapy;Electrical Stimulation;Iontophoresis 75m/ml Dexamethasone;Moist Heat;Ultrasound;DME Instruction;Neuromuscular re-education;Therapeutic exercise;Therapeutic activities;Functional mobility training;Patient/family education;Manual techniques;Taping;Dry needling;Passive range of motion;Vasopneumatic Device;Spinal Manipulations;Joint Manipulations    Consulted and Agree with Plan of Care  Patient        Patient will benefit from skilled therapeutic intervention in order to improve the following deficits and impairments:  Impaired UE functional use, Increased muscle spasms, Decreased activity tolerance, Pain, Improper body mechanics, Decreased strength  Visit Diagnosis: Acute pain of right shoulder     Problem List Patient Active Problem List   Diagnosis Date Noted   Right knee pain     LLyndee Hensen PT, DPT 8:46 AM  02/19/19    [facility] [address]Phone: [phone]  Fax:  [phone] Name: Marvin LefeberMRN: [id number] of Birth: 826-Dec-1992 PHYSICAL THERAPY DISCHARGE SUMMARY  Visits from Start of Care: 2 Plan: Patient agrees to discharge.  Patient goals were partially met. Patient is being discharged due to - not returning since last visit.    LLyndee Hensen PT, DPT 11:28 AM  04/06/21

## 2019-02-26 ENCOUNTER — Encounter: Payer: BC Managed Care – PPO | Admitting: Physical Therapy

## 2019-03-04 ENCOUNTER — Ambulatory Visit: Payer: BC Managed Care – PPO | Admitting: Family Medicine

## 2019-03-05 ENCOUNTER — Ambulatory Visit: Payer: BC Managed Care – PPO | Admitting: Family Medicine

## 2019-03-05 ENCOUNTER — Other Ambulatory Visit: Payer: Self-pay

## 2019-03-05 ENCOUNTER — Encounter: Payer: Self-pay | Admitting: Family Medicine

## 2019-03-05 VITALS — BP 128/82 | HR 90 | Ht 70.75 in | Wt 237.6 lb

## 2019-03-05 DIAGNOSIS — M25511 Pain in right shoulder: Secondary | ICD-10-CM | POA: Diagnosis not present

## 2019-03-05 NOTE — Progress Notes (Signed)
   I, Christoper Fabian, LAT, ATC, am serving as scribe for Dr. Clementeen Graham.  Marvin Mitchell is a 29 y.o. male who presents to [facility] at [facility] today for f/u of R shoulder pain x approximately 2 months.  He was last seen by Dr. Denyse Amass on 02/04/19 and was having 5/10 pain radiating into his R upper arm that was worse w/ horizontal aBd and R sidelying.  He had a R shoulder XR on 02/12/19 and has completed 2 PT sessions to date.  Since his last visit, pt reports improvement in his R shoulder pain and describes it more as an annoyance.  He con't to have pain w/ pressing-type motions w/ resistance.  Pt rates his improvement at 50%.  He also notes that he has persistent knee discomfort following patellar dislocation and what he describes as debridement surgery a few years ago at [facility] in [place].  Pertinent review of systems: No fevers or chills  Relevant historical information: As above   Exam:  BP 128/82 (BP Location: Left Arm, Patient Position: Sitting, Cuff Size: Large)   Pulse 90   Ht 5' 10.75" (1.797 m)   Wt 237 lb 9.6 oz (107.8 kg)   SpO2 98%   BMI 33.37 kg/m  General: Well Developed, well nourished, and in no acute distress.   MSK: Right shoulder: Normal-appearing nontender normal motion normal strength negative impingement testing.  Negative labrum testing.  Right knee: Normal-appearing normal motion with crepitation.  Stable ligamentous exam.  Normal gait.      Assessment and Plan: 29 y.o. male with right shoulder pain.  Significant improvement with only 2 episodes of physical therapy.  Plan to continue physical therapy for at least 1 more month.  If not satisfactorily improved following conservative management patient will let me know and we will proceed with likely MRI arthrogram to further characterize cause of pain and for potential surgical planning.  Persistent knee discomfort: We will request medical records to review and follow this  issue up in the future if needed.  Total encounter time 20 minutes including charting time date of service.   Discussed warning signs or symptoms. Please see discharge instructions. Patient expresses understanding.   The above documentation has been reviewed and is accurate and complete Clementeen Graham

## 2019-03-05 NOTE — Patient Instructions (Signed)
Thank you for coming in today. Continue PT.  Send me an updated in 1 month.  I will request records from [facility]. Work on shoulder stability.

## 2019-03-09 ENCOUNTER — Encounter: Payer: Self-pay | Admitting: Family Medicine

## 2019-03-09 NOTE — Progress Notes (Signed)
Received medical records from [facility].  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 Procedure: Right knee patellar chondroplasty and excision of suprapatellar shelf.   Did not receive MRI report.   Will be sent to scan

## 2019-10-08 ENCOUNTER — Other Ambulatory Visit: Payer: Self-pay

## 2019-10-08 ENCOUNTER — Ambulatory Visit: Payer: BC Managed Care – PPO | Admitting: Family Medicine

## 2019-10-08 ENCOUNTER — Encounter: Payer: Self-pay | Admitting: Family Medicine

## 2019-10-08 VITALS — BP 124/72 | HR 97 | Temp 98.7°F | Ht 70.75 in | Wt 252.0 lb

## 2019-10-08 DIAGNOSIS — R194 Change in bowel habit: Secondary | ICD-10-CM

## 2019-10-08 DIAGNOSIS — Z9189 Other specified personal risk factors, not elsewhere classified: Secondary | ICD-10-CM

## 2019-10-08 DIAGNOSIS — Z1159 Encounter for screening for other viral diseases: Secondary | ICD-10-CM | POA: Diagnosis not present

## 2019-10-08 DIAGNOSIS — F988 Other specified behavioral and emotional disorders with onset usually occurring in childhood and adolescence: Secondary | ICD-10-CM | POA: Diagnosis not present

## 2019-10-08 DIAGNOSIS — Z114 Encounter for screening for human immunodeficiency virus [HIV]: Secondary | ICD-10-CM | POA: Diagnosis not present

## 2019-10-08 DIAGNOSIS — Z23 Encounter for immunization: Secondary | ICD-10-CM | POA: Diagnosis not present

## 2019-10-08 MED ORDER — BUPROPION HCL ER (XL) 150 MG PO TB24: 150.0000 mg | ORAL_TABLET | Freq: Every day | ORAL | 5 refills | Status: DC

## 2019-10-08 NOTE — Assessment & Plan Note (Signed)
S: Patient has had increase in ADD symptoms . He has been on medication for both in the past. He has noticed increase in being hyper focused if he has a strong interest (absorbed into prepping for a cruise similar to focus on tv shows when younger) and having a hard time with focus at work. He has not been on medications after college.    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 Has been on concerta 50mg  through age 29 then took a break (felt like mainly getting side effects). After a while had to restart meds in college- saw a doctor in college family doc who put him on adderrall 5 mg for class- didn't work very well- preferred not to do extended release.   wellbutrin later tried and seemed to help with anxiety. Not sure if it helped as much with ADD as much A/P: Since patient seem to get significant relief from anxiety and possibly ADD in the past on Wellbutrin we opted to refill this.  He does not want to restart stimulants at this time-if he changes mind and wanted to consider stimulants we discussed needing formal records from prior ADD treatment/evaluation

## 2019-10-08 NOTE — Progress Notes (Signed)
Phone [phone] In person visit   Subjective:   Marvin Mitchell is a 29 y.o. year old very pleasant male patient who presents for/with See problem oriented charting Chief Complaint  Patient presents with  . GI Problem  . ADHD   This visit occurred during the SARS-CoV-2 public health emergency.  Safety protocols were in place, including screening questions prior to the visit, additional usage of staff PPE, and extensive cleaning of exam room while observing appropriate contact time as indicated for disinfecting solutions.   Past Medical History-  Patient Active Problem List   Diagnosis Date Noted  . Attention deficit disorder (ADD) in adult 10/08/2019    Priority: Medium  . Frequent bowel movements 10/08/2019  . Right knee pain     Medications- reviewed and updated-none prior to visit    Objective:  BP 124/72   Pulse 97   Temp 98.7 F (37.1 C) (Temporal)   Ht 5' 10.75" (1.797 m)   Wt 252 lb (114.3 kg)   SpO2 97%   BMI 35.40 kg/m  Gen: NAD, resting comfortably CV: RRR no murmurs rubs or gallops Lungs: CTAB no crackles, wheeze, rhonchi Abdomen: soft/mild abdominal pain on right and left side of abdomen diffusely/nondistended/normal bowel sounds. No rebound or guarding.  Ext: no edema Skin: warm, dry     Assessment and Plan   # Digestive issues-frequent bowel movements increasing and bloating S: Patient has had increase in Bowel movements. He has always had issues with having bowel movements 30 minutes after eating. He has increased to 3-5 times a day or more- 2 extra bowel movements in addition to meals at times. Remembers back to childhood having to have a BM right after eating at a restaurant. He feels like it is limiting activities. He does not want to be gone from home to long as a result.    occasionally gets mild stomach pain. In last year has had a lot of bloating- particularly in the morning. When comes from work feels overly full even if he has not  bene eating much.   Wonders if could be gluten but not always a trigger clearly.   Cut down/almost out  gluten about a year ago (mainly processed foods) and for 2 months had less bloating and less frequent BMs perhaps 2 a day.   He has read about fodmap type diet.   No blood in stool. No vomiting. No hematemesis. No unintentional weight loss. No fevers or night sweats. No abnormal fatigue.   Going to gym in morning until hurt his shoulders- walks in am then to gym with wife in morning.  A/P: Patient with history of frequent bowel movements recently increased associated with bloating and some abdominal discomfort.  Abdominal exam with some diffuse tenderness on left and right side of abdomen but otherwise reassuring.  I think we need to get some baseline labs as well as evaluate for celiac disease.  No other obvious red flags to push for colonoscopy or GI referral at this time.  Previously had some benefit with cutting out processed foods-particularly foods with gluten so this could be gluten sensitivity or other food sensitivity  In the meantime we discussed FODMAP diet and adjusting diet over time to eliminate some of these foods to see if this makes a difference.  We will follow-up in 2 months and regroup-could reconsider GI referral at that time  # Adult ADD /anxiety S: Patient has had increase in ADD symptoms . He has been on medication  for both in the past. He has noticed increase in being hyper focused if he has a strong interest (absorbed into prepping for a cruise similar to focus on tv shows when younger) and having a hard time with focus at work. He has not been on medications after college.    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 Has been on concerta 50mg  through age 25 then took a break (felt like mainly getting side effects). After a while had to restart meds in college- saw a doctor in college family doc who put him on adderrall 5 mg for class-  didn't work very well- preferred not to do extended release.   wellbutrin later tried and seemed to help with anxiety. Not sure if it helped as much with ADD as much A/P: Since patient seem to get significant relief from anxiety and possibly ADD in the past on Wellbutrin we opted to refill this.  He does not want to restart stimulants at this time-if he changes mind and wanted to consider stimulants we discussed needing formal records from prior ADD treatment/evaluation   Recommended follow up: Return in about 2 months (around 12/08/2019). Future Appointments  Date Time Provider Department Center  12/28/2019  4:20 PM 12/30/2019, MD [facility]    Lab/Order associations:   ICD-10-CM   1. Screening for HIV (human immunodeficiency virus)  Z11.4 HIV Antibody (routine testing w rflx)    HIV Antibody (routine testing w rflx)  2. Frequent bowel movements  R19.4 COMPLETE METABOLIC PANEL WITH GFR    CBC With Differential/Platelet    Gliadin antibodies, serum    Tissue transglutaminase, IgA    Reticulin Antibody, IgA w reflex titer    TSH    Reticulin Antibody, IgA w reflex titer    TSH    Tissue transglutaminase, IgA    Gliadin antibodies, serum    CBC With Differential/Platelet    COMPLETE METABOLIC PANEL WITH GFR    CANCELED: TSH  3. Attention deficit disorder (ADD) in adult  F98.8   4. Encounter for hepatitis C virus screening test for high risk patient  Z11.59 Hepatitis C antibody   Z91.89 Hepatitis C antibody  5. Need for immunization against influenza  Z23 Flu Vaccine QUAD 36+ mos IM    Meds ordered this encounter  Medications  . buPROPion (WELLBUTRIN XL) 150 MG 24 hr tablet    Sig: Take 1 tablet (150 mg total) by mouth daily.    Dispense:  30 tablet    Refill:  5     Return precautions advised.  Shelva Majestic, MD

## 2019-10-08 NOTE — Patient Instructions (Addendum)
Health Maintenance Due  Topic Date Due  . Hepatitis C Screening with  labs  Never done  . COVID-19 Vaccine (1) send message in my chart with dates Never done  . HIV Screening  Will get with labs Never done  . INFLUENZA VACCINE - today.  09/06/2019   Can try to cut out different groups from FODMAP for at least 2-3 weeks. If labs come back normal with prior success with cutting down on gluten that would also be reasonable.   Also trial wellbutrin since worked in past for anxiety/ADD concerns. Please let us know if you have any thoughts of self harm on this immediately.   Please stop by lab before you go If you have mychart- we will send your results within 3 business days of [place] receiving them.  If you do not have mychart- we will call you about results within 5 business days of [place] receiving them.  *please note we are currently using [facility] which has a longer processing time than [facility] typically so labs may not come back as quickly as in the past *please also note that you will see labs on mychart as soon as they post. I will later go in and write notes on them- will say "notes from Dr. Durene Cal"     Influenza (Flu) Vaccine (Inactivated or Recombinant): What You Need to Know 1. Why get vaccinated? Influenza vaccine can prevent influenza (flu). Flu is a contagious disease that spreads around the [place] every year, usually between October and May. Anyone can get the flu, but it is more dangerous for some people. Infants and young children, people 53 years of age and older, pregnant women, and people with certain health conditions or a weakened immune system are at greatest risk of flu complications. Pneumonia, bronchitis, sinus infections and ear infections are examples of flu-related complications. If you have a medical condition, such as heart disease, cancer or diabetes, flu can make it worse. Flu can cause fever and chills, sore throat, muscle aches, fatigue, cough, headache, and  runny or stuffy nose. Some people may have vomiting and diarrhea, though this is more common in children than adults. Each year thousands of people in the [place] die from flu, and many more are hospitalized. Flu vaccine prevents millions of illnesses and flu-related visits to the doctor each year. 2. Influenza vaccine CDC recommends everyone 20 months of age and older get vaccinated every flu season. Children 6 months through 69 years of age may need 2 doses during a single flu season. Everyone else needs only 1 dose each flu season. It takes about 2 weeks for protection to develop after vaccination. There are many flu viruses, and they are always changing. Each year a new flu vaccine is made to protect against three or four viruses that are likely to cause disease in the upcoming flu season. Even when the vaccine doesn't exactly match these viruses, it may still provide some protection. Influenza vaccine does not cause flu. Influenza vaccine may be given at the same time as other vaccines. 3. Talk with your health care provider Tell your vaccine provider if the person getting the vaccine:  Has had an allergic reaction after a previous dose of influenza vaccine, or has any severe, life-threatening allergies.  Has ever had Guillain-Barr Syndrome (also called GBS). In some cases, your health care provider may decide to postpone influenza vaccination to a future visit. People with minor illnesses, such as a cold, may be vaccinated. People  who are moderately or severely ill should usually wait until they recover before getting influenza vaccine. Your health care provider can give you more information. 4. Risks of a vaccine reaction  Soreness, redness, and swelling where shot is given, fever, muscle aches, and headache can happen after influenza vaccine.  There may be a very small increased risk of Guillain-Barr Syndrome (GBS) after inactivated influenza vaccine (the flu shot). Young children  who get the flu shot along with pneumococcal vaccine (PCV13), and/or DTaP vaccine at the same time might be slightly more likely to have a seizure caused by fever. Tell your health care provider if a child who is getting flu vaccine has ever had a seizure. People sometimes faint after medical procedures, including vaccination. Tell your provider if you feel dizzy or have vision changes or ringing in the ears. As with any medicine, there is a very remote chance of a vaccine causing a severe allergic reaction, other serious injury, or death. 5. What if there is a serious problem? An allergic reaction could occur after the vaccinated person leaves the clinic. If you see signs of a severe allergic reaction (hives, swelling of the face and throat, difficulty breathing, a fast heartbeat, dizziness, or weakness), call 9-1-1 and get the person to the nearest hospital. For other signs that concern you, call your health care provider. Adverse reactions should be reported to the Vaccine Adverse Event Reporting System (VAERS). Your health care provider will usually file this report, or you can do it yourself. Visit the VAERS website at www.vaers.LAgents.no or call 360-569-9525.VAERS is only for reporting reactions, and VAERS staff do not give medical advice. 6. The National Vaccine Injury Compensation Program The [employer] Vaccine Injury Compensation Program (VICP) is a federal program that was created to compensate people who may have been injured by certain vaccines. Visit the VICP website at SpiritualWord.at or call (423) 597-0103 to learn about the program and about filing a claim. There is a time limit to file a claim for compensation. 7. How can I learn more?  Ask your healthcare provider.  Call your local or state health department.  Contact the Centers for Disease Control and Prevention (CDC): ? Call (867)697-8660 (1-800-CDC-INFO) or ? Visit CDC's BiotechRoom.com.cy Vaccine Information  Statement (Interim) Inactivated Influenza Vaccine (09/19/2017) This information is not intended to replace advice given to you by your health care provider. Make sure you discuss any questions you have with your health care provider. Document Revised: 05/13/2018 Document Reviewed: 09/23/2017 Elsevier Patient Education  2020 [employer].   Recommended follow up: Return in about 2 months (around 12/08/2019).

## 2019-10-08 NOTE — Assessment & Plan Note (Signed)
S: Patient has had increase in Bowel movements. He has always had issues with having bowel movements 30 minutes after eating. He has increased to 3-5 times a day or more- 2 extra bowel movements in addition to meals at times. Remembers back to childhood having to have a BM right after eating at a restaurant. He feels like it is limiting activities. He does not want to be gone from home to long as a result.    occasionally gets mild stomach pain. In last year has had a lot of bloating- particularly in the morning. When comes from work feels overly full even if he has not bene eating much.   Wonders if could be gluten but not always a trigger clearly.   Cut down/almost out  gluten about a year ago (mainly processed foods) and for 2 months had less bloating and less frequent BMs perhaps 2 a day.   He has read about fodmap type diet.   No blood in stool. No vomiting. No hematemesis. No unintentional weight loss. No fevers or night sweats. No abnormal fatigue.   Going to gym in morning until hurt his shoulders- walks in am then to gym with wife in morning.  A/P: Patient with history of frequent bowel movements recently increased associated with bloating and some abdominal discomfort.  Abdominal exam with some diffuse tenderness on left and right side of abdomen but otherwise reassuring.  I think we need to get some baseline labs as well as evaluate for celiac disease.  No other obvious red flags to push for colonoscopy or GI referral at this time  In the meantime we discussed FODMAP diet and adjusting diet over time to eliminate some of these foods to see if this makes a difference.  We will follow-up in 2 months and regroup-could reconsider GI referral at that time

## 2019-10-13 LAB — CBC WITH DIFFERENTIAL/PLATELET
Absolute Monocytes: 407 cells/uL (ref 200–950)
Basophils Absolute: 50 cells/uL (ref 0–200)
Basophils Relative: 0.9 %
Eosinophils Absolute: 138 cells/uL (ref 15–500)
Eosinophils Relative: 2.5 %
HCT: 46.7 % (ref 38.5–50.0)
Hemoglobin: 16.1 g/dL (ref 13.2–17.1)
Lymphs Abs: 2134 cells/uL (ref 850–3900)
MCH: 31 pg (ref 27.0–33.0)
MCHC: 34.5 g/dL (ref 32.0–36.0)
MCV: 89.8 fL (ref 80.0–100.0)
MPV: 10.6 fL (ref 7.5–12.5)
Monocytes Relative: 7.4 %
Neutro Abs: 2772 cells/uL (ref 1500–7800)
Neutrophils Relative %: 50.4 %
Platelets: 261 10*3/uL (ref 140–400)
RBC: 5.2 10*6/uL (ref 4.20–5.80)
RDW: 12.8 % (ref 11.0–15.0)
Total Lymphocyte: 38.8 %
WBC: 5.5 10*3/uL (ref 3.8–10.8)

## 2019-10-13 LAB — COMPLETE METABOLIC PANEL WITH GFR
AG Ratio: 2 (calc) (ref 1.0–2.5)
ALT: 36 U/L (ref 9–46)
AST: 24 U/L (ref 10–40)
Albumin: 4.8 g/dL (ref 3.6–5.1)
Alkaline phosphatase (APISO): 31 U/L — ABNORMAL LOW (ref 36–130)
BUN: 18 mg/dL (ref 7–25)
CO2: 25 mmol/L (ref 20–32)
Calcium: 10 mg/dL (ref 8.6–10.3)
Chloride: 103 mmol/L (ref 98–110)
Creat: 1.18 mg/dL (ref 0.60–1.35)
GFR, Est African American: 97 mL/min/{1.73_m2} (ref >=60)
GFR, Est Non African American: 84 mL/min/{1.73_m2} (ref >=60)
Globulin: 2.4 g/dL (calc) (ref 1.9–3.7)
Glucose, Bld: 87 mg/dL (ref 65–99)
Potassium: 4.2 mmol/L (ref 3.5–5.3)
Sodium: 140 mmol/L (ref 135–146)
Total Bilirubin: 0.4 mg/dL (ref 0.2–1.2)
Total Protein: 7.2 g/dL (ref 6.1–8.1)

## 2019-10-13 LAB — GLIADIN ANTIBODIES, SERUM
Gliadin IgA: 3 Units
Gliadin IgG: 1 Units

## 2019-10-13 LAB — HEPATITIS C ANTIBODY
Hepatitis C Ab: NONREACTIVE
SIGNAL TO CUT-OFF: 0.01 (ref <1.00)

## 2019-10-13 LAB — RETICULIN ANTIBODIES, IGA W TITER: Reticulin IgA Screen: NEGATIVE

## 2019-10-13 LAB — TISSUE TRANSGLUTAMINASE, IGA: (tTG) Ab, IgA: 1 U/mL

## 2019-10-13 LAB — HIV ANTIBODY (ROUTINE TESTING W REFLEX): HIV 1&2 Ab, 4th Generation: NONREACTIVE

## 2019-10-13 LAB — TSH: TSH: 2.08 mIU/L (ref 0.40–4.50)

## 2019-10-17 ENCOUNTER — Encounter: Payer: Self-pay | Admitting: Family Medicine

## 2019-11-03 ENCOUNTER — Other Ambulatory Visit: Payer: Self-pay | Admitting: Family Medicine

## 2019-12-28 ENCOUNTER — Other Ambulatory Visit: Payer: Self-pay

## 2019-12-28 ENCOUNTER — Encounter: Payer: Self-pay | Admitting: Family Medicine

## 2019-12-28 ENCOUNTER — Ambulatory Visit (INDEPENDENT_AMBULATORY_CARE_PROVIDER_SITE_OTHER): Payer: BC Managed Care – PPO | Admitting: Family Medicine

## 2019-12-28 VITALS — BP 122/77 | HR 78 | Temp 98.2°F | Ht 70.0 in | Wt 251.6 lb

## 2019-12-28 DIAGNOSIS — F988 Other specified behavioral and emotional disorders with onset usually occurring in childhood and adolescence: Secondary | ICD-10-CM

## 2019-12-28 DIAGNOSIS — R194 Change in bowel habit: Secondary | ICD-10-CM

## 2019-12-28 DIAGNOSIS — F411 Generalized anxiety disorder: Secondary | ICD-10-CM

## 2019-12-28 NOTE — Patient Instructions (Addendum)
-  could try gas-x/simethicone and see if that's helpful for bloating portion.   -increase wellbutrin to 2 tablets daily over next 3-4 weeks. Update me at that time- if you are improving we will continue current dose. If no improvement at all likely go back to 150mg  dose for a few days then try to convert to typical anti anxiety medicines like lexapro/celexa/zoloft/prozac. My most common is lexapro.  -I am happy to write 90 day supply of this 300mg  if needed in future  We can consider short acting options for focus/ADD if needed- if no additional benefit for anxiety could go back to 150mg  dose and add stimulant  Try exercises 3x a week for a month and then at least weekly- if no better lets get you into sports medicine Dr. . Stop any exercise that worsens pain more than 1-2/10  Recommended follow up: Return in about 4 months (around 04/26/2020) for physical or sooner if needed.

## 2019-12-28 NOTE — Progress Notes (Signed)
Phone [phone] In person visit   Subjective:   Marvin Mitchell is a 29 y.o. year old very pleasant male patient who presents for/with See problem oriented charting Chief Complaint  Patient presents with  . Burpropion    follow up  . GI Problem    follow up    This visit occurred during the SARS-CoV-2 public health emergency.  Safety protocols were in place, including screening questions prior to the visit, additional usage of staff PPE, and extensive cleaning of exam room while observing appropriate contact time as indicated for disinfecting solutions.   Past Medical History-  Patient Active Problem List   Diagnosis Date Noted  . GAD (generalized anxiety disorder) 12/28/2019    Priority: Medium  . Attention deficit disorder (ADD) in adult 10/08/2019    Priority: Medium  . Frequent bowel movements 10/08/2019  . Right knee pain     Medications- reviewed and updated Current Outpatient Medications  Medication Sig Dispense Refill  . buPROPion (WELLBUTRIN XL) 150 MG 24 hr tablet TAKE 1 TABLET BY MOUTH EVERY DAY 90 tablet 2   No current facility-administered medications for this visit.     Objective:  BP 122/77   Pulse 78   Temp 98.2 F (36.8 C) (Temporal)   Ht 5\' 10"  (1.778 m)   Wt 251 lb 9.6 oz (114.1 kg)   SpO2 97%   BMI 36.10 kg/m  Gen: NAD, resting comfortably    Assessment and Plan   #Frequent bowel movements/abdominal pain/bloating S:eliminating FODMAP foods did not help with GI distress. Cutting out processed foods though seemed to make a big difference (burger/fast food/greasy). A few times eating out had more mild issues but did not last as long and recovered better.   Trying to make healtheir choices has been very helpful.    abdominal pain 75% better. Has some bloating but not as intense. Cutting out carbonation helped. frequent BMs better  A/P: Significant improvement in GI symptoms including frequent bowel movements/abdominal pain with cutting  down on processed foods.  Still has some bloating but not nearly as bothersome-could try gas-x/simethicone and see if that's helpful for bloating portion.  Continue to monitor without further intervention at this time  #Anxiety #ADD S:wellbutrin trial starting sept 2021- feels his mood has improved some- less anxious in social situations and finds himself thriving reasonably well. Anxiety can increase at times if interviewing for jobs for instance. Looking at new jobs that will be remote and thinks concentration issues may be a bigger deal.  Even though he feels like Wellbutrin helps with anxiety-Not sure its helping him as much with focus. Zones out at work at times.  Denies any suicidal ideation  -Prior ADD medication trials -concerta in elementary through 1- mainly felt side effects and friend stated zombie like.  -Tried adderrall 5 mg in college IR- appetite  Lower from college doctor. Wife thought it made him grouchy but he doesn't have major concerns.   GAD 7 : Generalized Anxiety Score 12/28/2019  Nervous, Anxious, on Edge 1  Control/stop worrying 1  Worry too much - different things 3  Trouble relaxing 1  Restless 0  Easily annoyed or irritable 1  Afraid - awful might happen 2  Total GAD 7 Score 9  Anxiety Difficulty Somewhat difficult  A/P: Considering patient with GAD-7 of 9 despite addition of Wellbutrin-suspect underlying generalized anxiety disorder in addition to ADD.  We discussed several options today -Convert to SSRI -Trial higher dose of Wellbutrin -Add counseling-did  not seem firmly interested at present  For now patient would like to try higher dose of Wellbutrin at 300 mg (we did not send in a new higher dose of medication but instead you simply want to take 2 of current tablet since he has many leftover) to see if this helps with ADD and anxiety.  If this is not effective we discussed possibly titrating back down and then converting to an SSRI and then trying short  acting medication such as Adderall 5 mg twice daily as needed for attention issues/ADD -Patient does very well we will simply follow-up at physical in approximately 4 to 5 months  # low back pain- intermittent issues and worse when he lays down and feels stress in back- feels like one leg shorter than another. Limits him at the gym. Bending down with weight can agitate it -Patient asks if we have any exercise available that may help with his low back-had a low back pain handout from sports medicine advisor. -We discussed if fails to improve within 3 to 4 weeks that we will place referral to sports medicine  Recommended follow up: Return in about 4 months (around 04/26/2020) for physical or sooner if needed. Future Appointments  Date Time Provider Department Center  05/10/2020  4:00 PM Shelva Majestic, MD [facility]    Lab/Order associations:   ICD-10-CM   1. Attention deficit disorder (ADD) in adult  F98.8   2. Frequent bowel movements  R19.4   3. GAD (generalized anxiety disorder)  F41.1    Time Spent: 30 minutes of total time (5:12 PM-5:36 PM, 8:21 PM-8:27 PM) was spent on the date of the encounter performing the following actions: chart review prior to seeing the patient, obtaining history, performing a medically necessary exam, counseling on the treatment plan, placing orders, and documenting in our EHR.   Return precautions advised.  Tana Conch, MD

## 2019-12-28 NOTE — Assessment & Plan Note (Signed)
#  Frequent bowel movements/abdominal pain/bloating S:eliminating FODMAP foods did not help with GI distress. Cutting out processed foods though seemed to make a big difference (burger/fast food/greasy). A few times eating out had more mild issues but did not last as long and recovered better.   Trying to make healtheir choices has been very helpful.    abdominal pain 75% better. Has some bloating but not as intense. Cutting out carbonation helped. frequent BMs better  A/P: Significant improvement in GI symptoms including frequent bowel movements/abdominal pain with cutting down on processed foods.  Still has some bloating but not nearly as bothersome-could try gas-x/simethicone and see if that's helpful for bloating portion.  Continue to monitor without further intervention at this time

## 2020-01-19 ENCOUNTER — Encounter: Payer: Self-pay | Admitting: Family Medicine

## 2020-01-20 MED ORDER — ESCITALOPRAM OXALATE 10 MG PO TABS: 10.0000 mg | ORAL_TABLET | Freq: Every day | ORAL | 5 refills | Status: DC

## 2020-02-25 MED ORDER — AMPHETAMINE-DEXTROAMPHETAMINE 5 MG PO TABS: 5.0000 mg | ORAL_TABLET | Freq: Two times a day (BID) | ORAL | 0 refills | Status: DC | PRN

## 2020-03-25 ENCOUNTER — Other Ambulatory Visit: Payer: Self-pay | Admitting: Family Medicine

## 2020-03-25 MED ORDER — AMPHETAMINE-DEXTROAMPHETAMINE 5 MG PO TABS: 5.0000 mg | ORAL_TABLET | Freq: Two times a day (BID) | ORAL | 0 refills | Status: DC | PRN

## 2020-05-02 ENCOUNTER — Other Ambulatory Visit: Payer: Self-pay | Admitting: Family Medicine

## 2020-05-03 MED ORDER — AMPHETAMINE-DEXTROAMPHETAMINE 5 MG PO TABS: 5.0000 mg | ORAL_TABLET | Freq: Two times a day (BID) | ORAL | 0 refills | Status: DC | PRN

## 2020-05-03 NOTE — Telephone Encounter (Signed)
Pt requesting refill for Adderall 5 mg BID. Has an appt 05/10/2020.

## 2020-05-06 NOTE — Patient Instructions (Addendum)
Please stop by lab before you go If you have mychart- we will send your results within 3 business days of [place] receiving them.  If you do not have mychart- we will call you about results within 5 business days of [place] receiving them.  *please also note that you will see labs on mychart as soon as they post. I will later go in and write notes on them- will say "notes from Dr. Durene Cal"  - controlled substance contract today- please set him up    Recommended follow up: 6 months, can do mychart refill in 3 months

## 2020-05-06 NOTE — Progress Notes (Signed)
Phone: [phone]    Subjective:  Patient presents today for their annual physical. Chief complaint-noted.     See problem oriented charting- ROS- full  review of systems was completed and negative  except for: mild decreased appetite with ADD meds but not bad  The following were reviewed and entered/updated in epic: Past Medical History:  Diagnosis Date  . Right knee pain    Patient Active Problem List   Diagnosis Date Noted  . GAD (generalized anxiety disorder) 12/28/2019    Priority: Medium  . Attention deficit disorder (ADD) in adult 10/08/2019    Priority: Medium  . Frequent bowel movements 10/08/2019  . Right knee pain    Past Surgical History:  Procedure Laterality Date  . EYE SURGERY     "cross eyed as child" 1st grade  . KNEE CARTILAGE SURGERY Right 01/2012    Family History  Problem Relation Age of Onset  . Diabetes Mother   . Stroke Father   . Hyperlipidemia Father   . Hypertension Father   . Healthy Sister   . Prostate cancer Paternal Grandfather        uknown age    Medications- reviewed and updated Current Outpatient Medications  Medication Sig Dispense Refill  . amphetamine-dextroamphetamine (ADDERALL) 5 MG tablet Take 1 tablet (5 mg total) by mouth 2 (two) times daily as needed. 60 tablet 0  . escitalopram (LEXAPRO) 10 MG tablet Take 1 tablet (10 mg total) by mouth daily. 30 tablet 5   No current facility-administered medications for this visit.    Allergies-reviewed and updated Allergies  Allergen Reactions  . Azithromycin Hives    Social History   Social History Narrative   Married December 28th 2019. 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       Undergrad [employer]- history major.    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       Hobbies: enjoys cooking, likes running, mountain biking      Objective:  BP 133/75   Pulse 74   Temp 98.7 F (37.1 C) (Temporal)   Ht 5\' 10"   (1.778 m)   Wt 254 lb 12.8 oz (115.6 kg)   SpO2 96%   BMI 36.56 kg/m  Gen: NAD, resting comfortably HEENT: Mucous membranes are moist. Oropharynx normal. Some cerumen in right ear, normal on left- TM normal  Neck: no thyromegaly CV: RRR no murmurs rubs or gallops Lungs: CTAB no crackles, wheeze, rhonchi Abdomen: soft/nontender/nondistended/normal bowel sounds. No rebound or guarding.  Ext: no edema Skin: warm, dry Neuro: grossly normal, moves all extremities, PERRLA    Assessment and Plan:  30 y.o. male presenting for annual physical.  Health Maintenance counseling: 1. Anticipatory guidance: Patient counseled regarding regular dental exams -q6 months, eye exams - yearly for contacts,  avoiding smoking and second hand smoke , limiting alcohol to 2 beverages per day- 5 per week.   2. Risk factor reduction:  Advised patient of need for regular exercise and diet rich and fruits and vegetables to reduce risk of heart attack and stroke. Exercise- back into gym now with back a little better and trying to walk with wife- thinks he can be more consistent with back being better. Diet-feels eating reasonably healthy- around 2000 calories per day on average- discussed gradual calorie reduction. Mom got diagnosed with diabetes- this is motivating for him Wt Readings from Last 3 Encounters:  05/10/20 254 lb 12.8 oz (115.6 kg)  12/28/19 251 lb  9.6 oz (114.1 kg)  10/08/19 252 lb (114.3 kg)  3. Immunizations/screenings/ancillary studies- up to date Immunization History  Administered Date(s) Administered  . Influenza,inj,Quad PF,6+ Mos 11/25/2017, 01/09/2019, 10/08/2019  . Influenza-Unspecified 11/23/2015  . PFIZER(Purple Top)SARS-COV-2 Vaccination 04/11/2019, 05/09/2019, 01/18/2020  . Tdap 12/01/2008, 01/09/2019  4. Prostate cancer screening-  no family history 1st degree relative (PGF when e was older), start at age 24 5. Colon cancer screening - no family history, start at age 36. No blood in  stool 6. Skin cancer screening/prevention- no dermatologist. advised regular sunscreen use. Denies worrisome, changing, or new skin lesions.  7. Testicular cancer screening- advised monthly self exams  8. STD screening- patient opts out as only active with wife 9. Never smoker-  Status of chronic or acute concerns   #GAD/ADD S:Medication: Wellbutrin 150Mg  later transitioned to Lexapro 10Mg  for GAD.  Her ADD has tolerated low-dose Adderall 5 mg instant release-wife previously thought he was grouchy on medication in the past but has done better with Lexapro -Focus not perfect-perhaps 60% better on medication but has noted some small decrease in appetite. Not near side effects of childhood Counseling: not at the moment A/P: GAD reasonably well controlled.  ADD not perfectly controlled but reasonable balance for side effects- if becomes less effective over time which is possible we will discuss possible 10 mg dose -original diagnosis Dr. in elementary school - controlled substance contract today- please set him up   -UDS today -PDMP reviewed  #Low back pain mention that November visit-we gave him some home exercises to work on and discussed sports medicine referral if fails to improve-he reports today did some exercises, did some body work, and then worked with chiropractor a few times- overall better but still tight - back to working out slowly. Worse if stuck in certain position for prolonged period such as prolonged standing or sitting in certain chairs  Recommended follow up: 6 months, can do mychart refill in 3 months  Lab/Order associations:not fasting   ICD-10-CM   1. Preventative health care  Z00.00   2. GAD (generalized anxiety disorder)  F41.1   3. Attention deficit disorder (ADD) in adult  F98.8   4. High risk medication use  Z79.899     No orders of the defined types were placed in this encounter.   Return precautions advised.   Lyn Hollingshead, MD

## 2020-05-10 ENCOUNTER — Other Ambulatory Visit: Payer: Self-pay

## 2020-05-10 ENCOUNTER — Encounter: Payer: Self-pay | Admitting: Family Medicine

## 2020-05-10 ENCOUNTER — Ambulatory Visit (INDEPENDENT_AMBULATORY_CARE_PROVIDER_SITE_OTHER): Payer: BC Managed Care – PPO | Admitting: Family Medicine

## 2020-05-10 VITALS — BP 133/75 | HR 74 | Temp 98.7°F | Ht 70.0 in | Wt 254.8 lb

## 2020-05-10 DIAGNOSIS — Z0000 Encounter for general adult medical examination without abnormal findings: Secondary | ICD-10-CM | POA: Diagnosis not present

## 2020-05-10 DIAGNOSIS — Z79899 Other long term (current) drug therapy: Secondary | ICD-10-CM | POA: Diagnosis not present

## 2020-05-10 DIAGNOSIS — F411 Generalized anxiety disorder: Secondary | ICD-10-CM | POA: Diagnosis not present

## 2020-05-10 DIAGNOSIS — F988 Other specified behavioral and emotional disorders with onset usually occurring in childhood and adolescence: Secondary | ICD-10-CM

## 2020-05-10 MED ORDER — AMPHETAMINE-DEXTROAMPHETAMINE 5 MG PO TABS
5.0000 mg | ORAL_TABLET | Freq: Two times a day (BID) | ORAL | 0 refills | Status: DC
Start: 2020-05-10 — End: 2020-12-22

## 2020-05-10 MED ORDER — AMPHETAMINE-DEXTROAMPHETAMINE 5 MG PO TABS: 5.0000 mg | ORAL_TABLET | Freq: Two times a day (BID) | ORAL | 0 refills | Status: DC

## 2020-05-13 LAB — DRUG MONITORING, PANEL 8 WITH CONFIRMATION, URINE
6 Acetylmorphine: NEGATIVE ng/mL (ref <10)
Alcohol Metabolites: NEGATIVE ng/mL
Amphetamine: 894 ng/mL — ABNORMAL HIGH (ref <250)
Amphetamines: POSITIVE ng/mL — AB (ref <500)
Benzodiazepines: NEGATIVE ng/mL (ref <100)
Buprenorphine, Urine: NEGATIVE ng/mL (ref <5)
Cocaine Metabolite: NEGATIVE ng/mL (ref <150)
Creatinine: 83.2 mg/dL
MDMA: NEGATIVE ng/mL (ref <500)
Marijuana Metabolite: NEGATIVE ng/mL (ref <20)
Methamphetamine: NEGATIVE ng/mL (ref <250)
Opiates: NEGATIVE ng/mL (ref <100)
Oxidant: NEGATIVE ug/mL
Oxycodone: NEGATIVE ng/mL (ref <100)
pH: 5.7 (ref 4.5–9.0)

## 2020-05-13 LAB — DM TEMPLATE

## 2020-06-11 IMAGING — DX DG SHOULDER 2+V*R*
3 series · 3 of 3 positions shown · non-contrast
Comparison: None.

CLINICAL DATA: Shoulder pain

EXAM:
RIGHT SHOULDER - 2+ VIEW

[shoulder grashey ap]
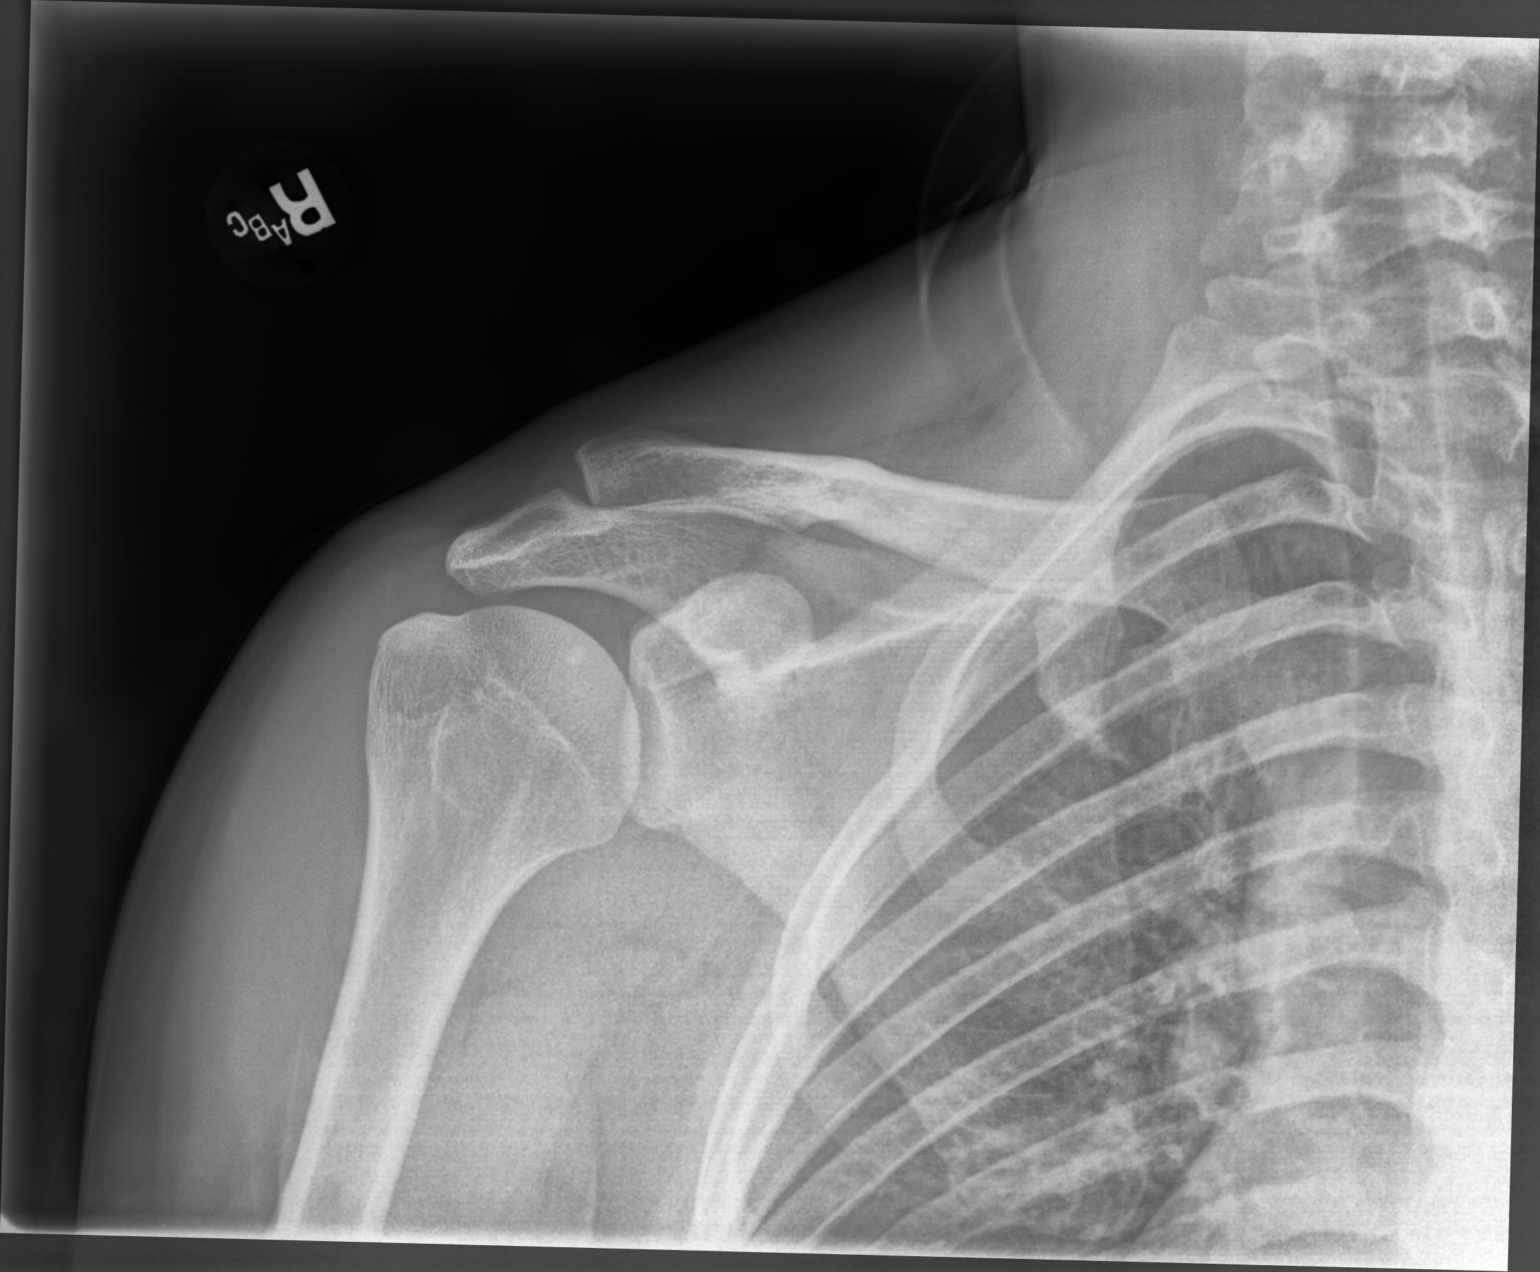

[shoulder y view]
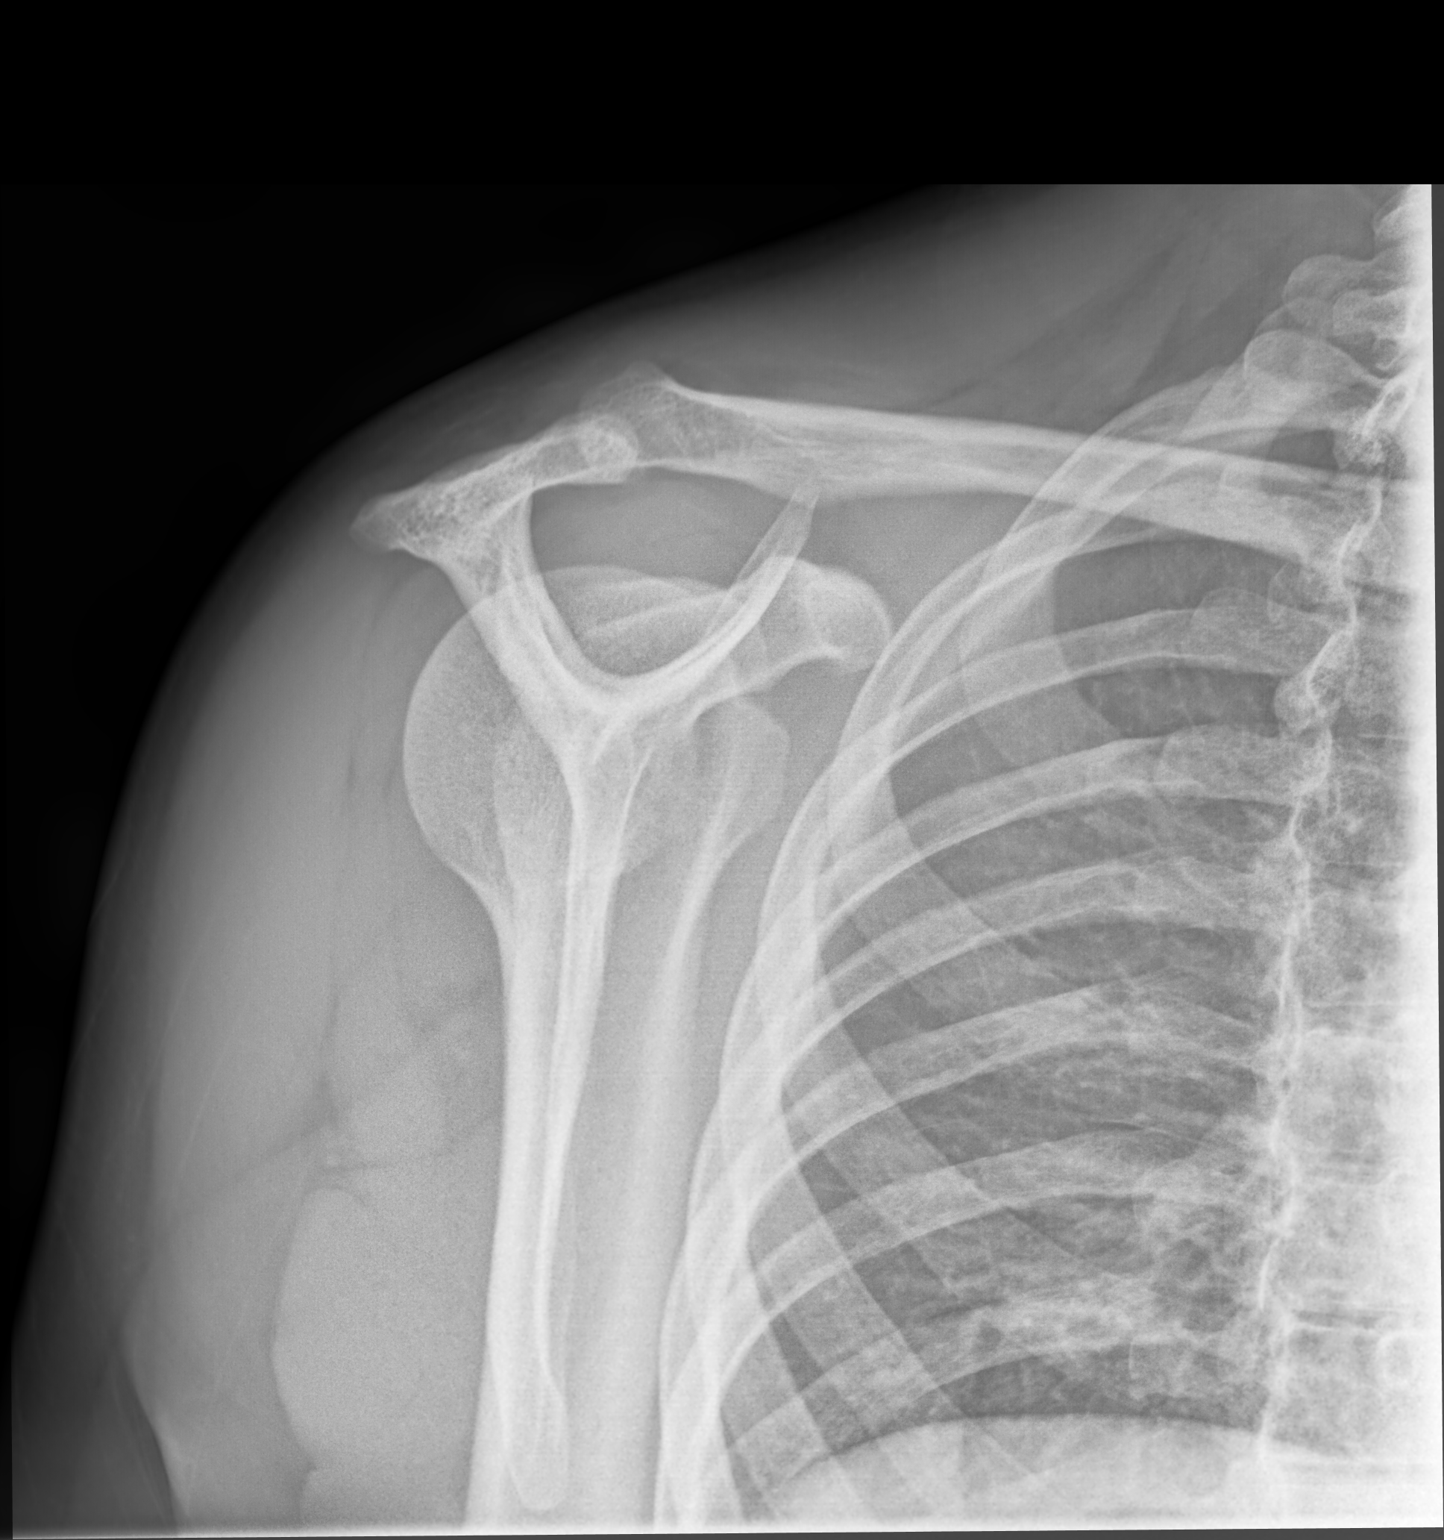

[shoulder axial]
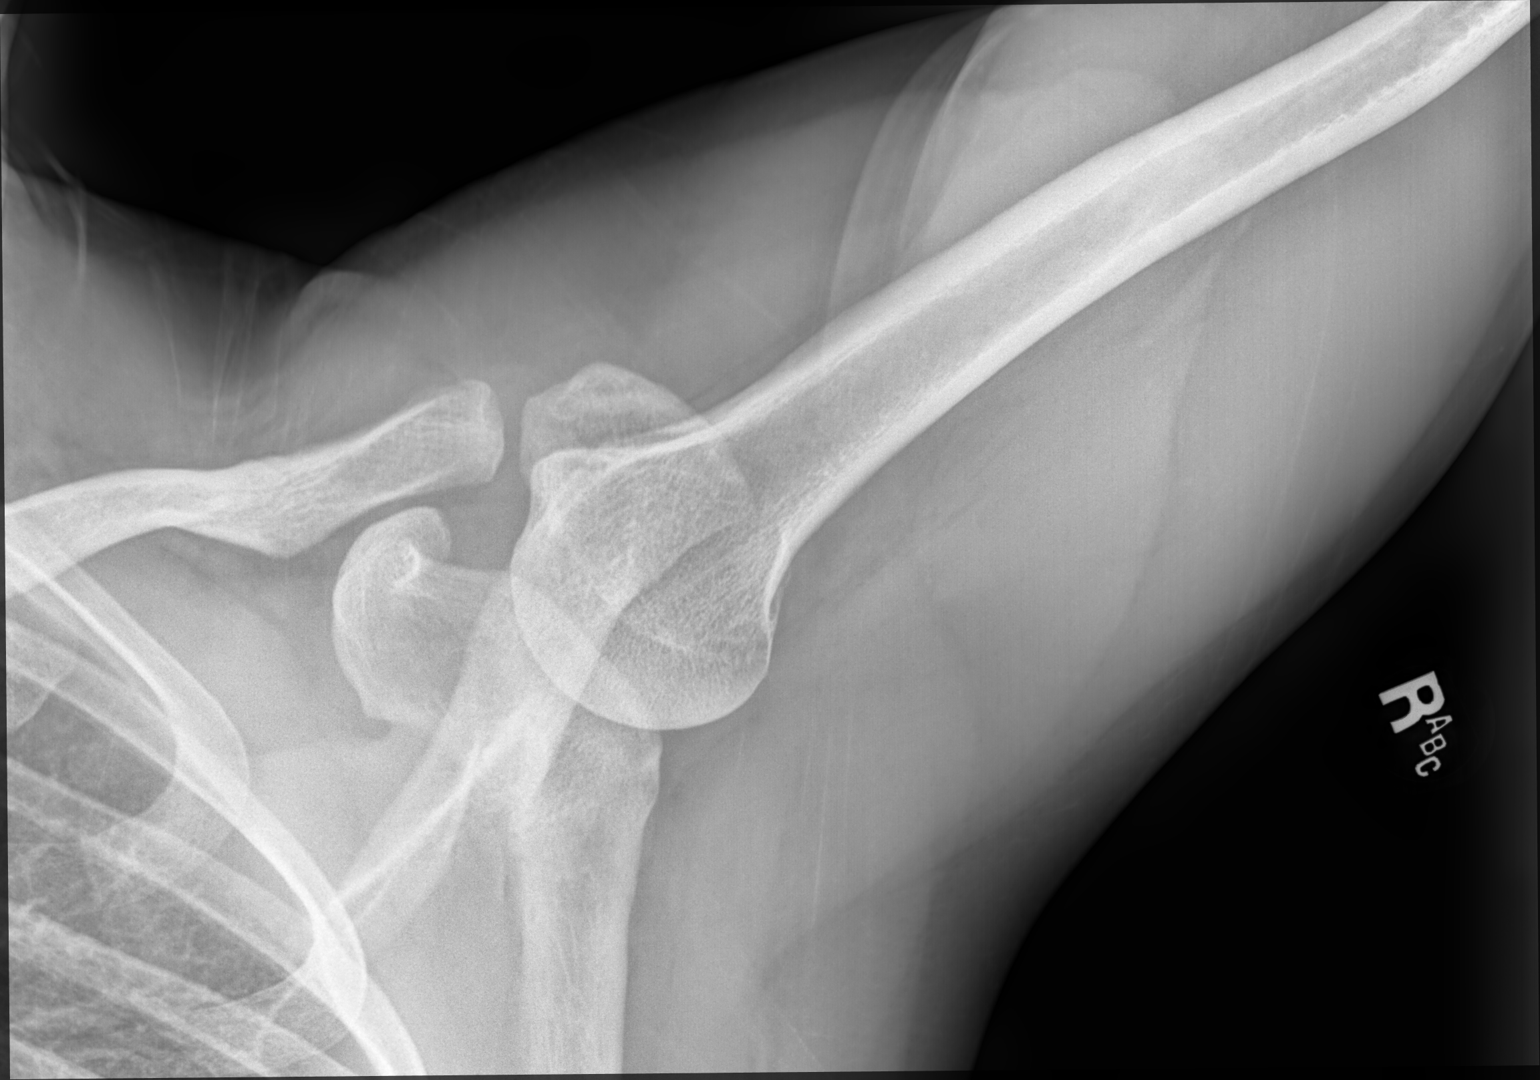

[3 of 3 positions shown; findings below may reference images not displayed]

FINDINGS: There is no evidence of fracture or dislocation. There is no
evidence of arthropathy or other focal bone abnormality. Soft
tissues are unremarkable.
IMPRESSION: Negative.

## 2020-07-22 ENCOUNTER — Other Ambulatory Visit: Payer: Self-pay | Admitting: Family Medicine

## 2020-11-10 ENCOUNTER — Ambulatory Visit: Payer: BC Managed Care – PPO | Admitting: Family Medicine

## 2020-12-06 ENCOUNTER — Ambulatory Visit: Payer: BC Managed Care – PPO | Admitting: Family Medicine

## 2020-12-22 ENCOUNTER — Ambulatory Visit: Payer: BC Managed Care – PPO | Admitting: Family Medicine

## 2020-12-22 ENCOUNTER — Other Ambulatory Visit: Payer: Self-pay

## 2020-12-22 ENCOUNTER — Encounter: Payer: Self-pay | Admitting: Family Medicine

## 2020-12-22 VITALS — BP 116/82 | HR 75 | Temp 98.2°F | Ht 70.0 in | Wt 260.2 lb

## 2020-12-22 DIAGNOSIS — Z23 Encounter for immunization: Secondary | ICD-10-CM

## 2020-12-22 DIAGNOSIS — F411 Generalized anxiety disorder: Secondary | ICD-10-CM | POA: Diagnosis not present

## 2020-12-22 DIAGNOSIS — F988 Other specified behavioral and emotional disorders with onset usually occurring in childhood and adolescence: Secondary | ICD-10-CM | POA: Diagnosis not present

## 2020-12-22 MED ORDER — ESCITALOPRAM OXALATE 10 MG PO TABS: 10.0000 mg | ORAL_TABLET | Freq: Every day | ORAL | 3 refills | Status: DC

## 2020-12-22 MED ORDER — AMPHETAMINE-DEXTROAMPHETAMINE 5 MG PO TABS: 5.0000 mg | ORAL_TABLET | Freq: Two times a day (BID) | ORAL | 0 refills | Status: DC | PRN

## 2020-12-22 NOTE — Progress Notes (Signed)
Phone [phone] In person visit   Subjective:   Marvin Mitchell is a 29 y.o. year old very pleasant male patient who presents for/with See problem oriented charting Chief Complaint  Patient presents with   Follow-up    Needs Adderall refilled.    This visit occurred during the SARS-CoV-2 public health emergency.  Safety protocols were in place, including screening questions prior to the visit, additional usage of staff PPE, and extensive cleaning of exam room while observing appropriate contact time as indicated for disinfecting solutions.   Past Medical History-  Patient Active Problem List   Diagnosis Date Noted   GAD (generalized anxiety disorder) 12/28/2019    Priority: Medium    Attention deficit disorder (ADD) in adult 10/08/2019    Priority: Medium    Frequent bowel movements 10/08/2019   Right knee pain     Medications- reviewed and updated Current Outpatient Medications  Medication Sig Dispense Refill   amphetamine-dextroamphetamine (ADDERALL) 5 MG tablet Take 1 tablet (5 mg total) by mouth 2 (two) times daily as needed. 60 tablet 0   escitalopram (LEXAPRO) 10 MG tablet Take 1 tablet (10 mg total) by mouth daily. 90 tablet 3   No current facility-administered medications for this visit.     Objective:  BP 116/82   Pulse 75   Temp 98.2 F (36.8 C)   Ht 5\' 10"  (1.778 m)   Wt 260 lb 3.2 oz (118 kg)   SpO2 99%   BMI 37.33 kg/m  Gen: NAD, resting comfortably CV: RRR no murmurs rubs or gallops Lungs: CTAB no crackles, wheeze, rhonchi Ext: no edema Skin: warm, dry    Assessment and Plan   # GAD/ADD S:Medication: Lexapro 10 mg for GAD and ADD has tolerated low-dose of Adderall 5 mg instant release without worsening anxiety. 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 May want to look at pulling back on  med if things go well with current job.   A/P: For GAD - reasonable control- no SI. Could look at reducing dose if things remain stable between now and physical For ADD - reasonable control with honestly sparing use based on fill patterns- continue current meds and refill today- recheck at 6 months physical- can provide refills until them.   -UDs done 05/10/20 at [facility]- low risk  - original diagnosis Dr. 07/10/20 in elementary school -he signed controlled substance contract last visit  # Low back pain S:Last visit patient reported pain was better but still tight - he was also working out slowly. Pain would be worse if stuck in certain position for prolonged period such as prolonged standing or sitting in certain chairs.  - patient also mentioned this back in November 2021 visit - we gave him some home exercises to work on and discussed sports medicine referral if failed to improve. He did do some exercises and then worked with a December 2021 a few times.  Today patient reports better overall if more consistent with stretching (doesn't always do). Has not been making it to the gym plus job more sedentary though- has to focus and make sure he does exercises/stretches for prevention. No recent chiropractor visits- last in summer  A/P: overall stable- did discuss doing stretches/exercises plus needs to reverse weight gain for long term back health. Up 6 lbs from last visit.   Recommended follow  up: Return in about 6 months (around 06/21/2021) for physical or sooner if needed.  Lab/Order associations:   ICD-10-CM   1. GAD (generalized anxiety disorder)  F41.1     2. Attention deficit disorder (ADD) in adult  F98.8     3. Need for immunization against influenza  Z23 Flu Vaccine QUAD 53mo+IM (Fluarix, Fluzone & Alfiuria Quad PF)     Meds ordered this encounter  Medications   amphetamine-dextroamphetamine (ADDERALL) 5 MG tablet    Sig: Take 1 tablet (5 mg total) by mouth 2 (two) times daily as  needed.    Dispense:  60 tablet    Refill:  0   escitalopram (LEXAPRO) 10 MG tablet    Sig: Take 1 tablet (10 mg total) by mouth daily.    Dispense:  90 tablet    Refill:  3    ZERO refills remain on this prescription. Your patient is requesting advance approval of refills for this medication to PREVENT ANY MISSED DOSES    I,Harris Phan,acting as a scribe for Tana Conch, MD.,have documented all relevant documentation on the behalf of Tana Conch, MD,as directed by  Tana Conch, MD while in the presence of Tana Conch, MD.   I, Tana Conch, MD, have reviewed all documentation for this visit. The documentation on 12/22/20 for the exam, diagnosis, procedures, and orders are all accurate and complete.   Return precautions advised.  Tana Conch, MD

## 2020-12-22 NOTE — Patient Instructions (Addendum)
Health Maintenance Due  Topic Date Due   COVID-19 Vaccine (4 - Booster for [employer] series) -had covid in july 03/14/2020   Thanks for doing flu shot!   Recommended follow up: Return in about 6 months (around 06/21/2021) for physical or sooner if needed. Do labs next visit

## 2021-06-22 ENCOUNTER — Encounter: Payer: Self-pay | Admitting: Family Medicine

## 2021-06-22 ENCOUNTER — Ambulatory Visit (INDEPENDENT_AMBULATORY_CARE_PROVIDER_SITE_OTHER): Payer: BC Managed Care – PPO | Admitting: Family Medicine

## 2021-06-22 VITALS — BP 140/80 | HR 76 | Temp 97.9°F | Ht 70.0 in | Wt 260.8 lb

## 2021-06-22 DIAGNOSIS — Z0000 Encounter for general adult medical examination without abnormal findings: Secondary | ICD-10-CM

## 2021-06-22 DIAGNOSIS — F411 Generalized anxiety disorder: Secondary | ICD-10-CM

## 2021-06-22 NOTE — Progress Notes (Signed)
Phone: [phone]    Subjective:  Patient presents today for their annual physical. Chief complaint-noted.   See problem oriented charting- ROS- full  review of systems was completed and negative per full ROS sheet  The following were reviewed and entered/updated in epic: Past Medical History:  Diagnosis Date   Right knee pain    Patient Active Problem List   Diagnosis Date Noted   GAD (generalized anxiety disorder) 12/28/2019    Priority: Medium    Attention deficit disorder (ADD) in adult 10/08/2019    Priority: Medium    Frequent bowel movements 10/08/2019   Right knee pain    Past Surgical History:  Procedure Laterality Date   EYE SURGERY     "cross eyed as child" 1st grade   KNEE CARTILAGE SURGERY Right 01/2012    Family History  Problem Relation Age of Onset   Diabetes Mother    Stroke Father    Hyperlipidemia Father    Hypertension Father    Healthy Sister    Prostate cancer Paternal Jon Gills        uknown age    Medications- reviewed and updated No current outpatient medications on file.   No current facility-administered medications for this visit.    Allergies-reviewed and updated Allergies  Allergen Reactions   Azithromycin Hives    Social History   Social History Narrative   Married December 28th 2019. 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    Prior [employer]   [employer]- history major.       Hobbies: enjoys cooking, likes running, mountain biking      Objective:  BP 140/80   Pulse 76   Temp 97.9 F (36.6 C)   Ht 5\' 10"  (1.778 m)   Wt 260 lb 12.8 oz (118.3 kg)   SpO2 98%   BMI 37.42 kg/m  Gen: NAD, resting comfortably HEENT: Mucous membranes are moist. Oropharynx normal Neck: no thyromegaly CV: RRR no murmurs rubs or gallops Lungs: CTAB no crackles, wheeze,  rhonchi Abdomen: soft/nontender/nondistended/normal bowel sounds. No rebound or guarding.  Ext: trace edema Skin: warm, dry Neuro: grossly normal, moves all extremities, PERRLA    Assessment and Plan:  31 y.o. male presenting for annual physical.  Health Maintenance counseling: 1. Anticipatory guidance: Patient counseled regarding regular dental exams -q6 months, eye exams - yearly visits for contacts,  avoiding smoking and second hand smoke, limiting alcohol to 2 beverages per day- 5 a week, no illicit drugs.   2. Risk factor reduction:  Advised patient of need for regular exercise and diet rich and fruits and vegetables to reduce risk of heart attack and stroke.  Exercise- working on this- will be doing 10k in august- doing running 2 days a week- had been doing more biking- trying to avoid shin splints.  Diet/weight management-up 6 pounds from last physical- feels like doing more regular meals would help him. - misses breakfast, snacks then eats larger lunch than he would like.  Wt Readings from Last 3 Encounters:  06/22/21 260 lb 12.8 oz (118.3 kg)  12/22/20 260 lb 3.2 oz (118 kg)  05/10/20 254 lb 12.8 oz (115.6 kg)  3. Immunizations/screenings/ancillary studies-up-to-date other than declines further COVID vaccination Immunization History  Administered Date(s) Administered   Influenza,inj,Quad PF,6+ Mos 11/25/2017, 01/09/2019, 10/08/2019, 12/22/2020   Influenza-Unspecified  11/23/2015   PFIZER(Purple Top)SARS-COV-2 Vaccination 04/11/2019, 05/09/2019, 01/18/2020   Tdap 12/01/2008, 01/09/2019  4. Prostate cancer screening- no first-degree family history-start screening at 52  5. Colon cancer screening -  no family history, start at age 52  6. Skin cancer screening/prevention-no dermatologist. advised regular sunscreen use. Denies worrisome, changing, or new skin lesions.  7. Testicular cancer screening- advised monthly self exams  8. STD screening- patient opts out as only active with  wife 9. Smoking associated screening-never smoker  Status of chronic or acute concerns   #social update- a lot of travel with company based out of [place]- usually 3 day trips but all over the country.   #GAD/ADD S: Medication: lexapro 10 mg for GAD and has tolerated Adderall 5 mg instant release without worsening anxiety - he with traveling a lot and forgot to take medicine for a while- feels he is coping reasonably well without it- off both- was starting to feel jittery again on adderall    06/22/2021    1:01 PM 05/10/2020    3:55 PM 12/28/2019    5:19 PM  Depression screen PHQ 2/9  Decreased Interest 0 0 0  Down, Depressed, Hopeless 0 0 0  PHQ - 2 Score 0 0 0  Altered sleeping 0    Tired, decreased energy 0    Change in appetite 0    Feeling bad or failure about yourself  0    Trouble concentrating 0    Moving slowly or fidgety/restless 0    Suicidal thoughts 0    PHQ-9 Score 0    Difficult doing work/chores Not difficult at all        06/22/2021    1:16 PM 12/28/2019    5:20 PM  GAD 7 : Generalized Anxiety Score  Nervous, Anxious, on Edge 1 1  Control/stop worrying 0 1  Worry too much - different things 0 3  Trouble relaxing 0 1  Restless 3 0  Easily annoyed or irritable 1 1  Afraid - awful might happen 0 2  Total GAD 7 Score 5 9  Anxiety Difficulty Not difficult at all Somewhat difficult  A/P: GAD reasonably controlled without meds- we will stay off. GAD7 of 5 only and no SI  For ADD-sparing use of medication-he wants to try to stay off for now -opts out of UDS for now since doing better nad not needing refill -Controlled substance contract on file May 16, 2020  #Low back pain-back to November 2021-does better with more consistent stretching - working on a 10k spartan race in august. Back hasnt flared thankfully. Has done in past but not this distance  Recommended follow up: Return in about 1 year (around 06/23/2022) for physical or sooner if needed.Schedule b4  you leave.  Lab/Order associations:NOT fasting- prefers to hold off on labs   ICD-10-CM   1. Preventative health care  Z00.00     2. GAD (generalized anxiety disorder)  F41.1      No orders of the defined types were placed in this encounter.   Return precautions advised.   Garret Reddish, MD

## 2021-06-22 NOTE — Patient Instructions (Addendum)
Opts out of labs today  Great job up in the exercise-I like your idea is to try to bring the weight down  Recommended follow up: Return in about 1 year (around 06/23/2022) for physical or sooner if needed.Schedule b4 you leave.

## 2021-10-30 ENCOUNTER — Encounter: Payer: Self-pay | Admitting: *Deleted

## 2022-01-18 ENCOUNTER — Encounter: Payer: Self-pay | Admitting: *Deleted

## 2022-06-25 ENCOUNTER — Encounter: Payer: BC Managed Care – PPO | Admitting: Family Medicine

## 2022-08-01 ENCOUNTER — Other Ambulatory Visit: Payer: Self-pay | Admitting: Sports Medicine

## 2022-08-01 DIAGNOSIS — G8929 Other chronic pain: Secondary | ICD-10-CM

## 2022-08-02 ENCOUNTER — Ambulatory Visit
Admission: RE | Admit: 2022-08-02 | Discharge: 2022-08-02 | Disposition: A | Discharge status: Home / Self Care | Payer: BC Managed Care – PPO | Source: Ambulatory Visit | Attending: Sports Medicine | Admitting: Sports Medicine

## 2022-08-02 DIAGNOSIS — G8929 Other chronic pain: Secondary | ICD-10-CM

## 2022-08-21 ENCOUNTER — Encounter: Payer: BC Managed Care – PPO | Admitting: Family Medicine

## 2022-10-10 ENCOUNTER — Ambulatory Visit: Payer: BC Managed Care – PPO | Admitting: Family Medicine

## 2023-03-25 ENCOUNTER — Ambulatory Visit: Payer: BC Managed Care – PPO | Admitting: Family Medicine

## 2023-03-25 ENCOUNTER — Encounter: Payer: Self-pay | Admitting: Family Medicine

## 2023-03-25 VITALS — BP 122/80 | HR 83 | Temp 97.6°F | Ht 70.0 in | Wt 255.6 lb

## 2023-03-25 DIAGNOSIS — F988 Other specified behavioral and emotional disorders with onset usually occurring in childhood and adolescence: Secondary | ICD-10-CM

## 2023-03-25 DIAGNOSIS — F411 Generalized anxiety disorder: Secondary | ICD-10-CM

## 2023-03-25 MED ORDER — AMPHETAMINE-DEXTROAMPHET ER 15 MG PO CP24: 15.0000 mg | ORAL_CAPSULE | ORAL | 0 refills | Status: DC

## 2023-03-25 NOTE — Patient Instructions (Addendum)
attention deficit disorder pooorly controlled off medicine. History generalized anxiety disorder but he feels like much of this is due to the attention issues and hed like to retrial the Adderall without Lexapro this time- the 5 mg twice daily dosing he retried recently and with not as much benefit plus taking twice daily or thrice daily can be annoying and wants to try the extended release and we wills end in 15 mg dose. If this works well- can continue this dose or go down if side effects too strong.  - if we need higher dose we discussed in office follow up  - if stay on same dose- I can send prescription up to 6 months  -He agrees to update me in 2 weeks or so  Recommended follow up: Return in about 6 months (around 09/22/2023) for physical or sooner if needed.Schedule b4 you leave.

## 2023-03-25 NOTE — Progress Notes (Signed)
Phone [phone] In person visit   Subjective:   Marvin Mitchell is a 33 y.o. year old very pleasant male patient who presents for/with See problem oriented charting Chief Complaint  Patient presents with   focusing issues    Pt c/o of issues focusing again. Has been on and off meds for a while and he started to notice it last month.    Past Medical History-  Patient Active Problem List   Diagnosis Date Noted   GAD (generalized anxiety disorder) 12/28/2019    Priority: Medium    Attention deficit disorder (ADD) in adult 10/08/2019    Priority: Medium    Frequent bowel movements 10/08/2019   Right knee pain     Medications- reviewed and updated Current Outpatient Medications  Medication Sig Dispense Refill   amphetamine-dextroamphetamine (ADDERALL XR) 15 MG 24 hr capsule Take 1 capsule by mouth every morning. 30 capsule 0   No current facility-administered medications for this visit.     Objective:  BP 122/80   Pulse 83   Temp 97.6 F (36.4 C)   Ht 5\' 10"  (1.778 m)   Wt 255 lb 9.6 oz (115.9 kg)   SpO2 97%   BMI 36.67 kg/m  Gen: NAD, resting comfortably CV: RRR no murmurs rubs or gallops Lungs: CTAB no crackles, wheeze, rhonchi Ext: no edema Skin: warm, dry     Assessment and Plan   #social update- has become a father since last visit! Going back to school  # Attention deficit disorder # GAD S:control: Reports worsening control of focus-has been off of medications at least since May 2023 for both GAD and ADD.  Ongoing mild anxiety symptoms- feels some of this is due to lack of focus and frustration from that . Feels anxiety even something like playing golf for instance- that he really enjoys. Also has noted work issues- harder to keep up with work issues.- jumps from task to task.  -also going back to school as above  medication: None.  Previously on Lexapro 10 mg and Adderall 5 mg instant release-he tolerated the short acting version.   Controlled  substance contract: May 16, 2020 NCCSRS/PDMP reviewed: no recent medications.  Original diagnosis: Dr. Lyn Hollingshead in elementary school  -Prior ADD medication trials -concerta in elementary through 18- mainly felt side effects and friend stated zombie like.  -Tried adderrall 5 mg in college IR- appetite lower from college doctor. Wife thought it made him grouchy but he doesn't have major concerns.  -no SI     03/25/2023    3:49 PM 06/22/2021    1:16 PM 12/28/2019    5:20 PM  GAD 7 : Generalized Anxiety Score  Nervous, Anxious, on Edge 1 1 1   Control/stop worrying 1 0 1  Worry too much - different things 1 0 3  Trouble relaxing 1 0 1  Restless 2 3 0  Easily annoyed or irritable 0 1 1  Afraid - awful might happen 0 0 2  Total GAD 7 Score 6 5 9   Anxiety Difficulty Not difficult at all Not difficult at all Somewhat difficult  A/P: attention deficit disorder poorly controlled off medicine. History generalized anxiety disorder but he feels like much of this is due to the attention issues and hed like to retrial the Adderall without Lexapro this time- the 5 mg twice daily dosing he retried recently and with not as much benefit plus taking twice daily or thrice daily can be annoying and wants to try the extended  release and we wills end in 15 mg dose. If this works well- can continue this dose or go down if side effects too strong.  - if we need higher dose we discussed in office follow up  - if stay on same dose- I can send prescription up to 6 months  -he will update me in 2 to 3 weeks with how he is feeling  Recommended follow up: Return in about 6 months (around 09/22/2023) for physical or sooner if needed.Schedule b4 you leave. Future Appointments  Date Time Provider Department Center  09/24/2023  4:00 PM Shelva Majestic, MD [facility]   Lab/Order associations:   ICD-10-CM   1. Attention deficit disorder (ADD) in adult  F98.8     2. GAD (generalized anxiety disorder)  F41.1        Meds ordered this encounter  Medications   amphetamine-dextroamphetamine (ADDERALL XR) 15 MG 24 hr capsule    Sig: Take 1 capsule by mouth every morning.    Dispense:  30 capsule    Refill:  0    Return precautions advised.  Tana Conch, MD

## 2023-03-25 NOTE — Assessment & Plan Note (Signed)
#   Attention deficit disorder # GAD S:control: Reports worsening control of focus-has been off of medications at least since May 2023 for both GAD and ADD.  Ongoing mild anxiety symptoms- feels some of this is due to lack of focus and frustration from that . Feels anxiety even something like playing golf for instance- that he really enjoys. Also has noted work issues- harder to keep up with work issues.- jumps from task to task.  -also going back to school as above  medication: None.  Previously on Lexapro 10 mg and Adderall 5 mg instant release-he tolerated the short acting version.   Controlled substance contract: May 16, 2020 NCCSRS/PDMP reviewed: no recent medications.  Original diagnosis: Dr. Lyn Hollingshead in elementary school  -Prior ADD medication trials -concerta in elementary through 18- mainly felt side effects and friend stated zombie like.  -Tried adderrall 5 mg in college IR- appetite lower from college doctor. Wife thought it made him grouchy but he doesn't have major concerns.  -no SI     03/25/2023    3:49 PM 06/22/2021    1:16 PM 12/28/2019    5:20 PM  GAD 7 : Generalized Anxiety Score  Nervous, Anxious, on Edge 1 1 1   Control/stop worrying 1 0 1  Worry too much - different things 1 0 3  Trouble relaxing 1 0 1  Restless 2 3 0  Easily annoyed or irritable 0 1 1  Afraid - awful might happen 0 0 2  Total GAD 7 Score 6 5 9   Anxiety Difficulty Not difficult at all Not difficult at all Somewhat difficult  A/P: attention deficit disorder pooorly controlled off medicine. History generalized anxiety disorder but he feels like much of this is due to the attention issues and hed like to retrial the Adderall without Lexapro this time- the 5 mg twice daily dosing he retried recently and with not as much benefit plus taking twice daily or thrice daily can be annoying and wants to try the extended release and we wills end in 15 mg dose. If this works well- can continue this dose or go down  if side effects too strong.  - if we need higher dose we discussed in office follow up  - if stay on same dose- I can send prescription up to 6 months

## 2023-04-02 ENCOUNTER — Ambulatory Visit: Payer: BC Managed Care – PPO | Admitting: Family Medicine

## 2023-04-12 ENCOUNTER — Encounter: Payer: Self-pay | Admitting: Family Medicine

## 2023-04-12 ENCOUNTER — Other Ambulatory Visit: Payer: Self-pay | Admitting: Family Medicine

## 2023-04-12 MED ORDER — AMPHETAMINE-DEXTROAMPHET ER 15 MG PO CP24
15.0000 mg | ORAL_CAPSULE | ORAL | 0 refills | Status: DC
Start: 2023-04-12 — End: 2023-04-24

## 2023-04-24 ENCOUNTER — Other Ambulatory Visit: Payer: Self-pay | Admitting: Family Medicine

## 2023-04-24 MED ORDER — AMPHETAMINE-DEXTROAMPHET ER 15 MG PO CP24: 15.0000 mg | ORAL_CAPSULE | ORAL | 0 refills | Status: DC

## 2023-08-23 ENCOUNTER — Encounter: Payer: Self-pay | Admitting: Advanced Practice Midwife

## 2023-09-24 ENCOUNTER — Encounter: Payer: Self-pay | Admitting: Family Medicine

## 2023-09-24 ENCOUNTER — Ambulatory Visit (INDEPENDENT_AMBULATORY_CARE_PROVIDER_SITE_OTHER): Payer: BC Managed Care – PPO | Admitting: Family Medicine

## 2023-09-24 VITALS — BP 118/72 | HR 80 | Temp 97.9°F | Ht 70.0 in | Wt 252.0 lb

## 2023-09-24 DIAGNOSIS — Z0000 Encounter for general adult medical examination without abnormal findings: Secondary | ICD-10-CM | POA: Diagnosis not present

## 2023-09-24 DIAGNOSIS — Z13220 Encounter for screening for lipoid disorders: Secondary | ICD-10-CM | POA: Diagnosis not present

## 2023-09-24 DIAGNOSIS — E669 Obesity, unspecified: Secondary | ICD-10-CM

## 2023-09-24 DIAGNOSIS — Z131 Encounter for screening for diabetes mellitus: Secondary | ICD-10-CM

## 2023-09-24 DIAGNOSIS — Z130 Encounter for screening for diseases of the blood and blood-forming organs and certain disorders involving the immune mechanism: Secondary | ICD-10-CM | POA: Diagnosis not present

## 2023-09-24 DIAGNOSIS — Z79899 Other long term (current) drug therapy: Secondary | ICD-10-CM

## 2023-09-24 DIAGNOSIS — F988 Other specified behavioral and emotional disorders with onset usually occurring in childhood and adolescence: Secondary | ICD-10-CM | POA: Diagnosis not present

## 2023-09-24 DIAGNOSIS — F411 Generalized anxiety disorder: Secondary | ICD-10-CM

## 2023-09-24 MED ORDER — AMPHETAMINE-DEXTROAMPHET ER 15 MG PO CP24: 15.0000 mg | ORAL_CAPSULE | ORAL | 0 refills | Status: DC

## 2023-09-24 NOTE — Patient Instructions (Addendum)
 Please stop by lab before you go If you have mychart- we will send your results within 3 business days of us  receiving them.  If you do not have mychart- we will call you about results within 5 business days of us  receiving them.  *please also note that you will see labs on mychart as soon as they post. I will later go in and write notes on them- will say notes from Dr. Katrinka  -urine if you can  Thrilled you are doing so well with so much on your plate- keep up the great work- ongoing exercise, gradual weight loss  Recommended follow up: Return in about 6 months (around 03/26/2024) for followup or sooner if needed.Schedule b4 you leave.

## 2023-09-24 NOTE — Progress Notes (Signed)
 Phone: [phone]    Subjective:  Patient presents today for their annual physical. Chief complaint-noted.   See problem oriented charting- ROS- full  review of systems was completed and negative  Per full ROS sheet completed by patient except for topics noted under acute/chronic concerns  The following were reviewed and entered/updated in epic: Past Medical History:  Diagnosis Date   Anxiety    Depression    GERD (gastroesophageal reflux disease)    Right knee pain    Patient Active Problem List   Diagnosis Date Noted   GAD (generalized anxiety disorder) 12/28/2019    Priority: Medium    Attention deficit disorder (ADD) in adult 10/08/2019    Priority: Medium    Frequent bowel movements 10/08/2019   Right knee pain    Past Surgical History:  Procedure Laterality Date   EYE SURGERY     cross eyed as child 1st grade   KNEE CARTILAGE SURGERY Right 01/2012    Family History  Problem Relation Age of Onset   Diabetes Mother    Stroke Father        a fib related   Hyperlipidemia Father    Hypertension Father    Cancer Father    Atrial fibrillation Father    Healthy Sister    Prostate cancer Paternal Grandfather        uknown age    Medications- reviewed and updated Current Outpatient Medications  Medication Sig Dispense Refill   amphetamine -dextroamphetamine  (ADDERALL XR) 15 MG 24 hr capsule Take 1 capsule by mouth every morning. 30 capsule 0   No current facility-administered medications for this visit.    Allergies-reviewed and updated Allergies  Allergen Reactions   Azithromycin Hives    Social History   Social History Narrative   Married December 28th 2019. 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  Dad as of Sept 4 2024- baby girl.    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    Prior [employer]   Undergrad [employer]- history  major.       Hobbies: enjoys cooking, likes running, mountain biking      Objective:  BP 118/72 (BP Location: Left Arm, Patient Position: Sitting, Cuff Size: Normal)   Pulse 80   Temp 97.9 F (36.6 C) (Temporal)   Ht 5' 10 (1.778 m)   Wt 252 lb (114.3 kg)   SpO2 95%   BMI 36.16 kg/m  Gen: NAD, resting comfortably HEENT: Mucous membranes are moist. Oropharynx normal Neck: no thyromegaly CV: RRR no murmurs rubs or gallops Lungs: CTAB no crackles, wheeze, rhonchi Abdomen: soft/nontender/nondistended/normal bowel sounds. No rebound or guarding.  Ext: no edema Skin: warm, dry Neuro: grossly normal, moves all extremities, PERRLA Declines genitourinary and rectal concerns or exam need     Assessment and Plan:  33 y.o. male presenting for annual physical.  Health Maintenance counseling: 1. Anticipatory guidance: Patient counseled regarding regular dental exams -q6 months, eye exams - yearly,  avoiding smoking and second hand smoke , limiting alcohol to 2 beverages per day- 1-2 a week, no illicit drugs.   2. Risk factor reduction:  Advised patient of need for regular exercise and diet rich and fruits and vegetables to reduce risk of heart attack and stroke.  Exercise- run/walking and increasing running portion- body weight strength training at home- walks golf course.  Diet/weight management-down 3 lbs this visit and 8 lbs from visit prior to that- nice trajectory- cooking at home more- feels ok on portions- plus appetite lower on adderall.  Wt Readings from Last 3 Encounters:  09/24/23 252 lb (114.3 kg)  03/25/23 255 lb 9.6 oz (115.9 kg)  06/22/21 260 lb 12.8 oz (118.3 kg)   3. Immunizations/screenings/ancillary studies- recommended fall flu shot. Consider all COVID shot as well Immunization History  Administered Date(s) Administered   Influenza,inj,Quad PF,6+ Mos 11/25/2017, 01/09/2019, 10/08/2019, 12/22/2020, 12/20/2022   Influenza-Unspecified 11/23/2015   PFIZER(Purple  Top)SARS-COV-2 Vaccination 04/11/2019, 05/09/2019, 01/18/2020   Pfizer(Comirnaty)Fall Seasonal Vaccine 12 years and older 12/20/2022   Tdap 12/01/2008, 01/09/2019, 10/10/2022  4. Prostate cancer screening-dad with prostate cancer at 26- consider screening at 50  5. Colon cancer screening - no family history, start at age 69  6. Skin cancer screening/prevention- no dermatology. advised regular sunscreen use. Denies worrisome, changing, or new skin lesions.  7. Testicular cancer screening- advised monthly self exams  8. STD screening- patient opts out- only active with wife 9. Smoking associated screening- never smoker  Status of chronic or acute concerns   #social update- almost 31 year old at home! She's in daycare. Baby sleeps well  # attention deficit disorder # generalized anxiety disorder S: Last visit reported worsening control of focus being off of Adderall since May 2023 for both GAD and ADD (felt like poor focus produced more anxiety) and we opted to restart Adderall-in the past had been on Adderall along with Lexapro .  Had not benefited from 5 mg twice daily of Adderall so we tried extended release 15 mg-he reports working well but does affect appetite so has to be intentional about eating or can begin to feel bad - feels anxiety has improved on treatment as well- would just get stuck before    09/24/2023    4:04 PM 03/25/2023    3:49 PM 06/22/2021    1:16 PM 12/28/2019    5:20 PM  GAD 7 : Generalized Anxiety Score  Nervous, Anxious, on Edge 0 1 1 1   Control/stop worrying 0 1 0 1  Worry too much - different things 0 1 0 3  Trouble relaxing 0 1 0 1  Restless 0 2 3 0  Easily annoyed or irritable 0 0 1 1  Afraid - awful might happen 0 0 0 2  Total GAD 7 Score 0 6 5 9   Anxiety Difficulty Not difficult at all Not difficult at all Not difficult at all Somewhat difficult   A/P: attention deficit disorder well controlled - continue current medications . Generalized anxiety disorder  also improved on medications.  Needs refill on the Adderall XR- didn't use as much in the summer -prescription drug monitoring program reviewed and low risk -UDS ordered if he can pee  #screen hyperlipidemia- never told had high triglyceride(s) so should be ok nonfasting  Recommended follow up: Return in about 6 months (around 03/26/2024) for followup or sooner if needed.Schedule b4 you leave.  Lab/Order associations:NOT fasting   ICD-10-CM   1. Preventative health care  Z00.00     2. GAD (generalized anxiety disorder)  F41.1     3. Attention deficit disorder (ADD) in adult  F98.8     4. Screening for hyperlipidemia  Z13.220     5. Screening for deficiency anemia  Z13.0     6. Screening for diabetes mellitus  Z13.1     7. Obesity (BMI 30-39.9)  E66.9  8. High risk medication use  Z79.899       No orders of the defined types were placed in this encounter.   Return precautions advised.   Garnette Lukes, MD

## 2023-09-25 ENCOUNTER — Ambulatory Visit: Payer: Self-pay | Admitting: Family Medicine

## 2023-09-25 LAB — COMPREHENSIVE METABOLIC PANEL WITH GFR
ALT: 31 U/L (ref 0–53)
AST: 29 U/L (ref 0–37)
Albumin: 4.9 g/dL (ref 3.5–5.2)
Alkaline Phosphatase: 33 U/L — ABNORMAL LOW (ref 39–117)
BUN: 17 mg/dL (ref 6–23)
CO2: 27 meq/L (ref 19–32)
Calcium: 9.7 mg/dL (ref 8.4–10.5)
Chloride: 100 meq/L (ref 96–112)
Creatinine, Ser: 1.19 mg/dL (ref 0.40–1.50)
GFR: 81.66 mL/min (ref >60.00)
Glucose, Bld: 105 mg/dL — ABNORMAL HIGH (ref 70–99)
Potassium: 3.9 meq/L (ref 3.5–5.1)
Sodium: 138 meq/L (ref 135–145)
Total Bilirubin: 0.5 mg/dL (ref 0.2–1.2)
Total Protein: 7.7 g/dL (ref 6.0–8.3)

## 2023-09-25 LAB — CBC WITH DIFFERENTIAL/PLATELET
Basophils Absolute: 0.1 K/uL (ref 0.0–0.1)
Basophils Relative: 1.1 % (ref 0.0–3.0)
Eosinophils Absolute: 0.1 K/uL (ref 0.0–0.7)
Eosinophils Relative: 2.6 % (ref 0.0–5.0)
HCT: 44.6 % (ref 39.0–52.0)
Hemoglobin: 15.1 g/dL (ref 13.0–17.0)
Lymphocytes Relative: 44.2 % (ref 12.0–46.0)
Lymphs Abs: 2.1 K/uL (ref 0.7–4.0)
MCHC: 34 g/dL (ref 30.0–36.0)
MCV: 87.9 fl (ref 78.0–100.0)
Monocytes Absolute: 0.3 K/uL (ref 0.1–1.0)
Monocytes Relative: 6.6 % (ref 3.0–12.0)
Neutro Abs: 2.2 K/uL (ref 1.4–7.7)
Neutrophils Relative %: 45.5 % (ref 43.0–77.0)
Platelets: 231 K/uL (ref 150.0–400.0)
RBC: 5.07 Mil/uL (ref 4.22–5.81)
RDW: 12.7 % (ref 11.5–15.5)
WBC: 4.8 K/uL (ref 4.0–10.5)

## 2023-09-25 LAB — LIPID PANEL
Cholesterol: 250 mg/dL — ABNORMAL HIGH (ref 0–200)
HDL: 33.2 mg/dL — ABNORMAL LOW (ref >39.00)
LDL Cholesterol: 154 mg/dL — ABNORMAL HIGH (ref 0–99)
NonHDL: 216.71
Total CHOL/HDL Ratio: 8
Triglycerides: 315 mg/dL — ABNORMAL HIGH (ref 0.0–149.0)
VLDL: 63 mg/dL — ABNORMAL HIGH (ref 0.0–40.0)

## 2023-09-25 LAB — HEMOGLOBIN A1C: Hgb A1c MFr Bld: 5.9 % (ref 4.6–6.5)

## 2023-11-18 ENCOUNTER — Other Ambulatory Visit: Payer: Self-pay | Admitting: Family Medicine

## 2023-11-18 MED ORDER — AMPHETAMINE-DEXTROAMPHET ER 15 MG PO CP24: 15.0000 mg | ORAL_CAPSULE | ORAL | 0 refills | Status: AC

## 2023-11-18 NOTE — Telephone Encounter (Unsigned)
 Copied from CRM [id number]. Topic: Clinical - Medication Refill >> Nov 18, 2023 11:53 AM Shereese L wrote: Medication: amphetamine -dextroamphetamine  (ADDERALL XR) 15 MG 24 hr capsule  Has the patient contacted their pharmacy? Yes (Agent: If no, request that the patient contact the pharmacy for the refill. If patient does not wish to contact the pharmacy document the reason why and proceed with request.) (Agent: If yes, when and what did the pharmacy advise?)  This is the patient's preferred pharmacy:   [facility] [address] [place] [address] Phone: [phone] Fax: [phone]  Is this the correct pharmacy for this prescription? Yes If no, delete pharmacy and type the correct one.   Has the prescription been filled recently? Yes  Is the patient out of the medication? Yes  Has the patient been seen for an appointment in the last year OR does the patient have an upcoming appointment? Yes  Can we respond through MyChart? Yes  Agent: Please be advised that Rx refills may take up to 3 business days. We ask that you follow-up with your pharmacy.

## 2023-11-19 ENCOUNTER — Other Ambulatory Visit (HOSPITAL_BASED_OUTPATIENT_CLINIC_OR_DEPARTMENT_OTHER): Payer: Self-pay

## 2024-02-28 ENCOUNTER — Telehealth: Payer: Self-pay | Admitting: Family Medicine

## 2024-02-28 NOTE — Telephone Encounter (Signed)
 LVM to reschedule pt's 09/25/2024 physical as the Provider will no longer be in office that day. Cancelled existing appt

## 2024-03-26 ENCOUNTER — Ambulatory Visit: Admitting: Family Medicine

## 2024-09-25 ENCOUNTER — Encounter: Admitting: Family Medicine
